# Patient Record
Sex: Female | Born: 1960 | Race: White | Hispanic: No | Marital: Single | Smoking: Never smoker
Health system: Southern US, Community
[De-identification: ages and names within clinical notes are randomized; demographics above are authoritative.]

## PROBLEM LIST (undated history)

## (undated) DIAGNOSIS — F79 Unspecified intellectual disabilities: Secondary | ICD-10-CM

## (undated) DIAGNOSIS — E871 Hypo-osmolality and hyponatremia: Secondary | ICD-10-CM

## (undated) DIAGNOSIS — M6281 Muscle weakness (generalized): Secondary | ICD-10-CM

## (undated) DIAGNOSIS — G40802 Other epilepsy, not intractable, without status epilepticus: Secondary | ICD-10-CM

## (undated) DIAGNOSIS — R569 Unspecified convulsions: Secondary | ICD-10-CM

## (undated) DIAGNOSIS — E785 Hyperlipidemia, unspecified: Secondary | ICD-10-CM

## (undated) DIAGNOSIS — I509 Heart failure, unspecified: Secondary | ICD-10-CM

## (undated) DIAGNOSIS — R532 Functional quadriplegia: Secondary | ICD-10-CM

## (undated) DIAGNOSIS — J45909 Unspecified asthma, uncomplicated: Secondary | ICD-10-CM

## (undated) DIAGNOSIS — I10 Essential (primary) hypertension: Secondary | ICD-10-CM

## (undated) DIAGNOSIS — S069X9A Unspecified intracranial injury with loss of consciousness of unspecified duration, initial encounter: Secondary | ICD-10-CM

## (undated) DIAGNOSIS — R1310 Dysphagia, unspecified: Secondary | ICD-10-CM

## (undated) DIAGNOSIS — G40909 Epilepsy, unspecified, not intractable, without status epilepticus: Secondary | ICD-10-CM

## (undated) DIAGNOSIS — M24541 Contracture, right hand: Secondary | ICD-10-CM

## (undated) DIAGNOSIS — F603 Borderline personality disorder: Secondary | ICD-10-CM

## (undated) DIAGNOSIS — R262 Difficulty in walking, not elsewhere classified: Secondary | ICD-10-CM

## (undated) DIAGNOSIS — G40411 Other generalized epilepsy and epileptic syndromes, intractable, with status epilepticus: Secondary | ICD-10-CM

## (undated) DIAGNOSIS — S069XAA Unspecified intracranial injury with loss of consciousness status unknown, initial encounter: Secondary | ICD-10-CM

## (undated) DIAGNOSIS — D509 Iron deficiency anemia, unspecified: Secondary | ICD-10-CM

## (undated) DIAGNOSIS — N390 Urinary tract infection, site not specified: Secondary | ICD-10-CM

## (undated) DIAGNOSIS — F73 Profound intellectual disabilities: Secondary | ICD-10-CM

## (undated) DIAGNOSIS — R293 Abnormal posture: Secondary | ICD-10-CM

## (undated) DIAGNOSIS — G40309 Generalized idiopathic epilepsy and epileptic syndromes, not intractable, without status epilepticus: Secondary | ICD-10-CM

## (undated) DIAGNOSIS — K219 Gastro-esophageal reflux disease without esophagitis: Secondary | ICD-10-CM

## (undated) DIAGNOSIS — J449 Chronic obstructive pulmonary disease, unspecified: Secondary | ICD-10-CM

## (undated) HISTORY — PX: OTHER SURGICAL HISTORY: SHX169

---

## 2004-03-10 ENCOUNTER — Encounter: Admission: RE | Admit: 2004-03-10 | Discharge: 2004-03-10 | Payer: Self-pay | Admitting: Internal Medicine

## 2011-03-17 ENCOUNTER — Emergency Department (HOSPITAL_COMMUNITY)
Admission: EM | Admit: 2011-03-17 | Discharge: 2011-03-17 | Disposition: A | Discharge status: Home / Self Care | Payer: Medicare Other | Attending: Emergency Medicine | Admitting: Emergency Medicine

## 2011-03-17 ENCOUNTER — Emergency Department (HOSPITAL_COMMUNITY): Payer: Medicare Other

## 2011-03-17 ENCOUNTER — Encounter (HOSPITAL_COMMUNITY): Payer: Self-pay

## 2011-03-17 DIAGNOSIS — S0083XA Contusion of other part of head, initial encounter: Secondary | ICD-10-CM | POA: Insufficient documentation

## 2011-03-17 DIAGNOSIS — Z79899 Other long term (current) drug therapy: Secondary | ICD-10-CM | POA: Insufficient documentation

## 2011-03-17 DIAGNOSIS — F79 Unspecified intellectual disabilities: Secondary | ICD-10-CM | POA: Insufficient documentation

## 2011-03-17 DIAGNOSIS — G40909 Epilepsy, unspecified, not intractable, without status epilepticus: Secondary | ICD-10-CM | POA: Insufficient documentation

## 2011-03-17 DIAGNOSIS — IMO0002 Reserved for concepts with insufficient information to code with codable children: Secondary | ICD-10-CM | POA: Insufficient documentation

## 2011-03-17 DIAGNOSIS — Z87820 Personal history of traumatic brain injury: Secondary | ICD-10-CM | POA: Insufficient documentation

## 2011-03-17 DIAGNOSIS — S0003XA Contusion of scalp, initial encounter: Secondary | ICD-10-CM | POA: Insufficient documentation

## 2011-03-17 DIAGNOSIS — W050XXA Fall from non-moving wheelchair, initial encounter: Secondary | ICD-10-CM | POA: Insufficient documentation

## 2011-03-17 HISTORY — DX: Unspecified convulsions: R56.9

## 2011-03-17 HISTORY — DX: Unspecified intellectual disabilities: F79

## 2011-03-17 HISTORY — DX: Unspecified intracranial injury with loss of consciousness of unspecified duration, initial encounter: S06.9X9A

## 2011-03-17 HISTORY — DX: Unspecified intracranial injury with loss of consciousness status unknown, initial encounter: S06.9XAA

## 2012-03-16 IMAGING — CT CT MAXILLOFACIAL W/O CM
3 of 4 series · 16 of 30 positions shown, 19 images · non-contrast
Comparison: None.

CT HEAD

CLINICAL DATA: Status post fall.  Head and facial injury.  Unknown
loss of consciousness.

CT HEAD WITHOUT CONTRAST
CT MAXILLOFACIAL WITHOUT CONTRAST
TECHNIQUE: Multidetector CT imaging of the head and maxillofacial
structures were performed using the standard protocol without
intravenous contrast. Multiplanar CT image reconstructions of the
maxillofacial structures were also generated.

[Series 2: fast head · axial · 0.47mm/px · z∈[+179,+263]mm · 3 of 32 slices shown]
[im 8/32  bone]
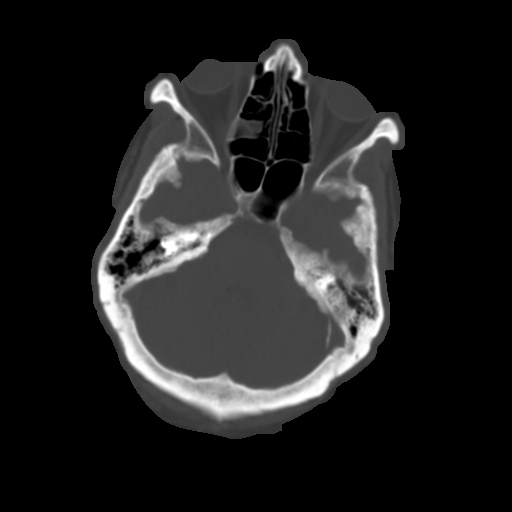
[im 16/32  bone]
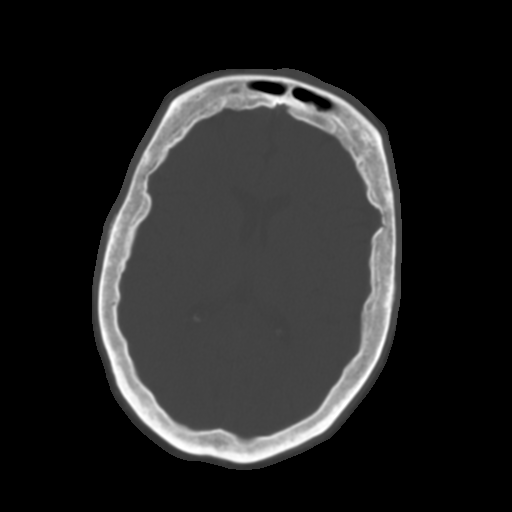
[im 24/32  bone]
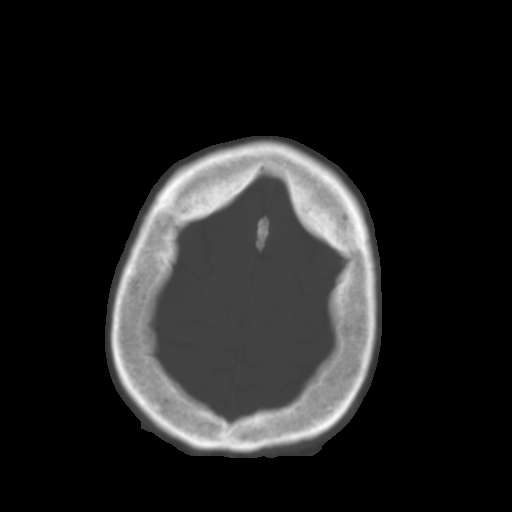

[Series 6: recon 2: supine facial bones · axial · 0.36mm/px · z∈[+52,+207]mm · 9 of 78 slices shown, 12 images]
[im 8/78  brain]
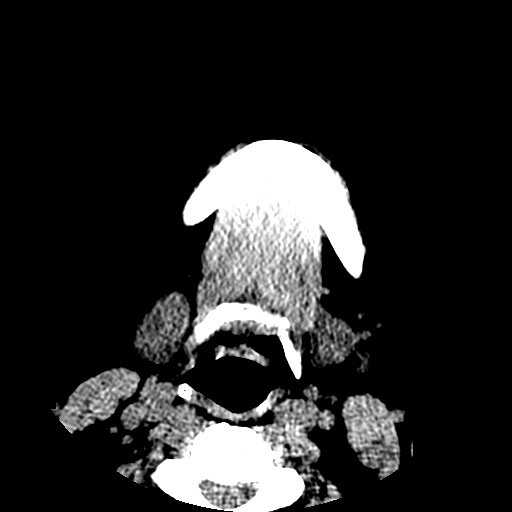
[im 8/78  bone]
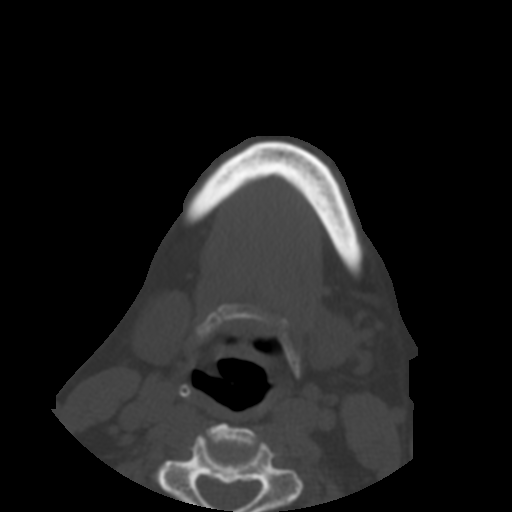
[im 16/78  bone]
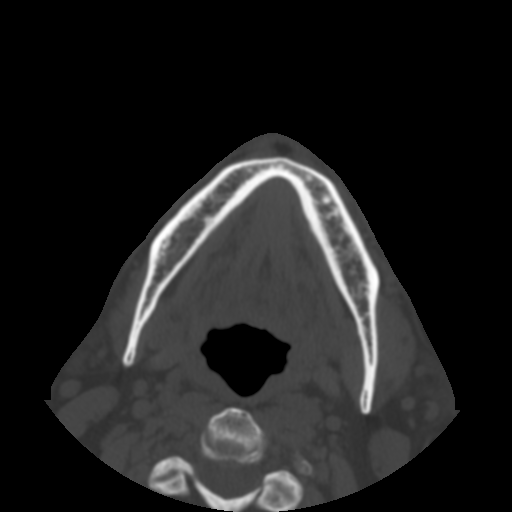
[im 24/78  bone]
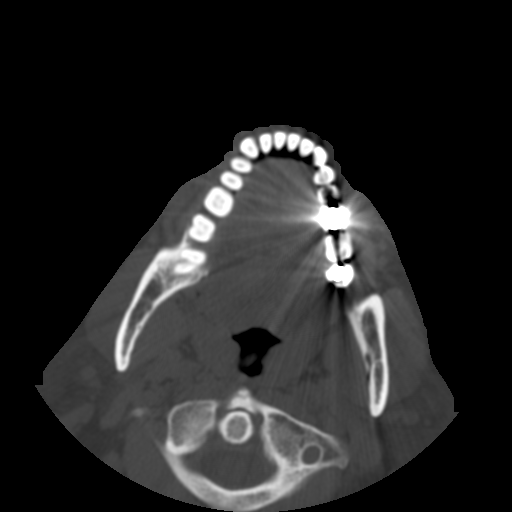
[im 31/78  bone]
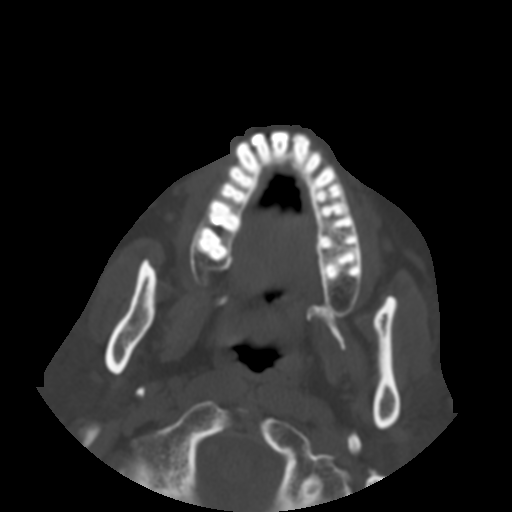
[im 39/78  brain]
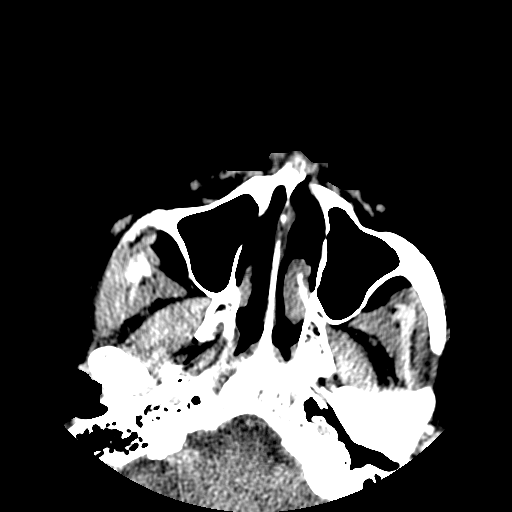
[im 39/78  bone]
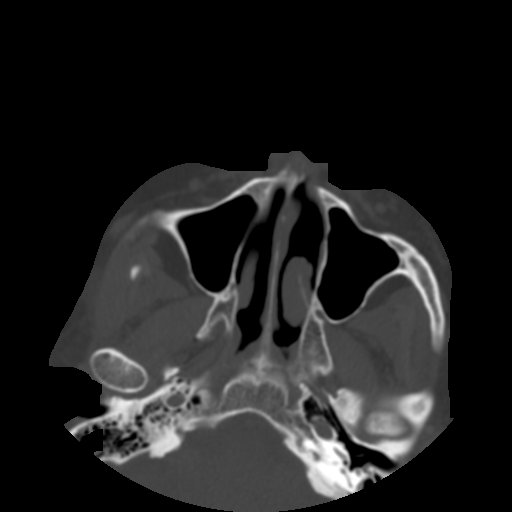
[im 47/78  bone]
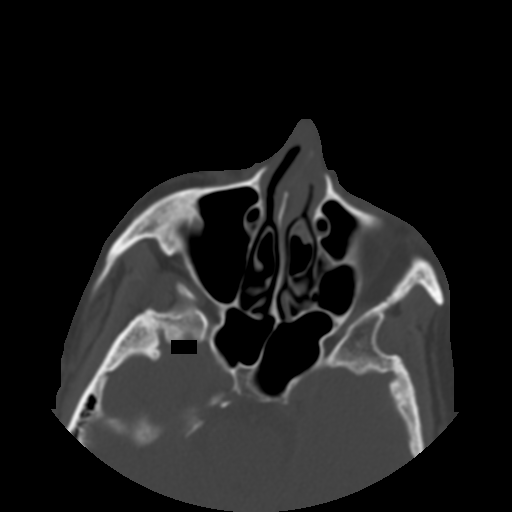
[im 54/78  bone]
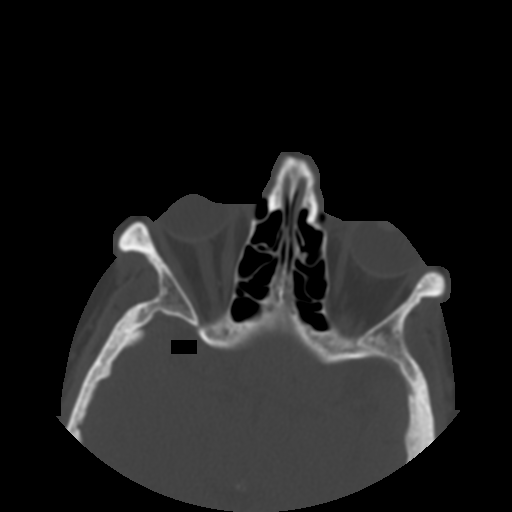
[im 62/78  bone]
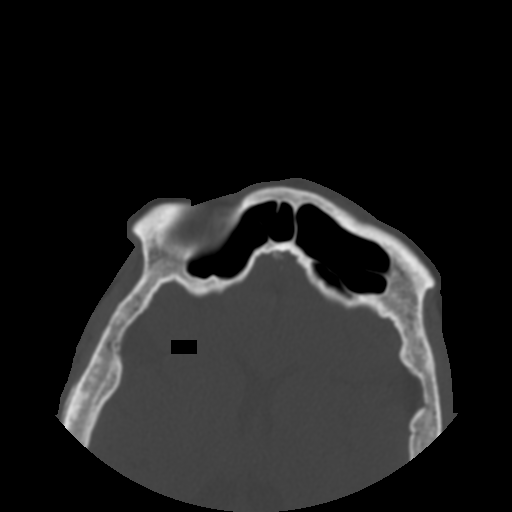
[im 70/78  brain]
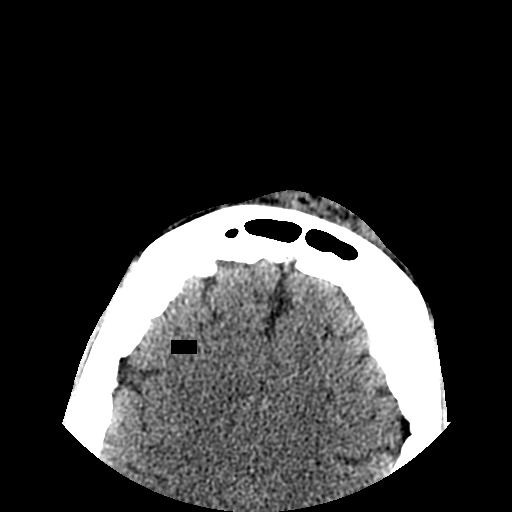
[im 70/78  bone]
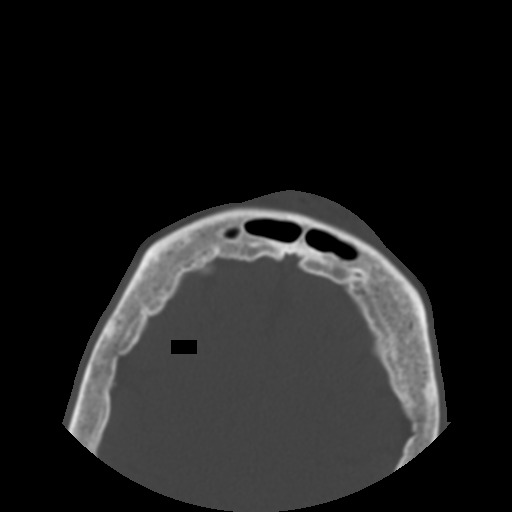

[Series 106: coronal · coronal · 0.43mm/px · 4 of 52 slices shown]
[im 9/52  bone]
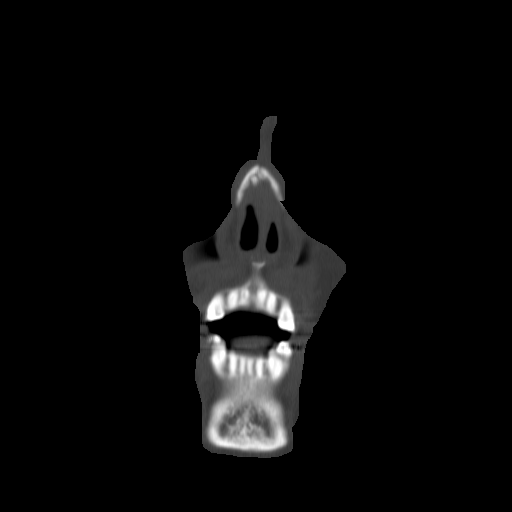
[im 18/52  bone]
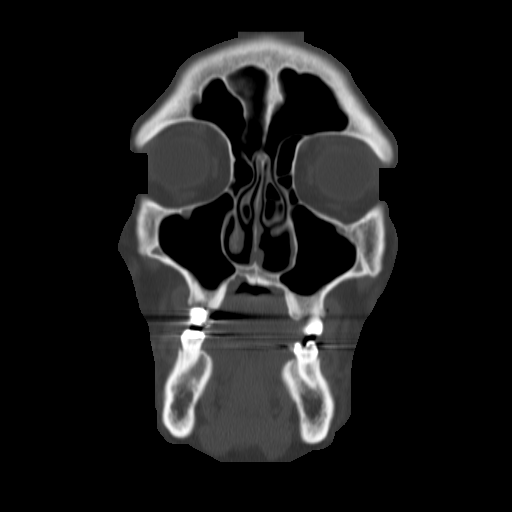
[im 26/52  bone]
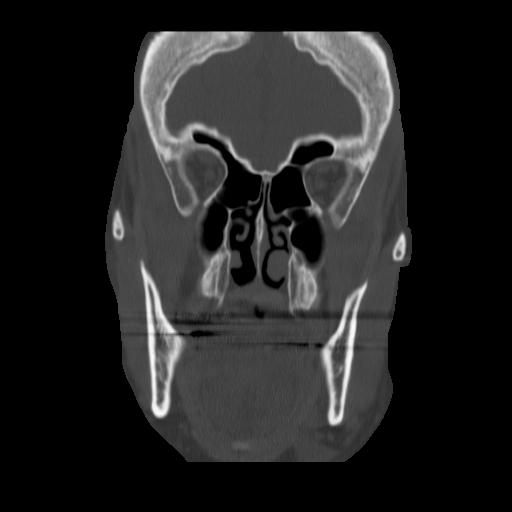
[im 35/52  bone]
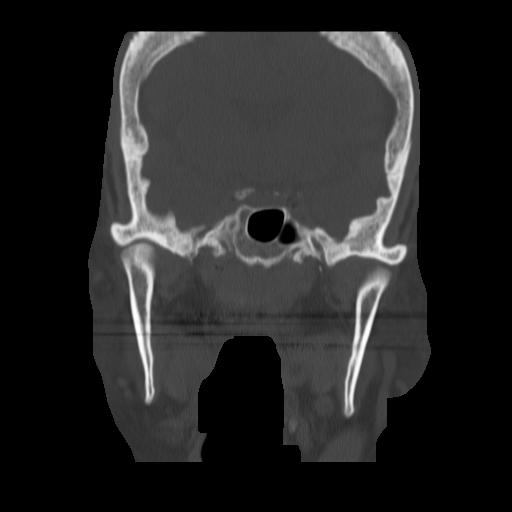

[16 of 30 positions shown; findings below may reference images not displayed]

FINDINGS: There is no evidence of acute intracranial hemorrhage,
mass lesion, brain edema or extra-axial fluid collection.  The
ventricles and subarachnoid spaces are appropriately sized for age.
There is no CT evidence of acute cortical infarction.

The visualized paranasal sinuses are clear. There is soft tissue
swelling in the left frontal scalp.  The calvarium is intact.
There is generalized calvarial hyperostosis.
IMPRESSION: Left frontal scalp injury.  No acute intracranial or calvarial
findings.

CT MAXILLOFACIAL
FINDINGS: There is some irregularity of the right nasal bone
which is suspected to be nonacute.  No definite acute facial
fractures are identified.  There is minimal posterior ethmoid
opacification on the right without air fluid levels.  The
additional paranasal sinuses are clear.  Mastoids and middle ears
are clear.  The mandible and temporomandibular joints are intact.

Frontal scalp soft tissue swelling extends over the bridge of the
nose and left periorbital region.  There is no evidence of orbital
hematoma.  The globes are intact.
IMPRESSION: 1.  Superior facial and left periorbital soft tissue swelling.  No
evidence of orbital hematoma.
2.  Slight irregularity of the right nasal bone may not be acute,
although a nondisplaced fracture in this area is difficult to
exclude.  No displaced fractures are identified.
3.  Minimal right ethmoid opacification.

## 2013-03-29 DIAGNOSIS — G40309 Generalized idiopathic epilepsy and epileptic syndromes, not intractable, without status epilepticus: Secondary | ICD-10-CM

## 2013-05-03 DIAGNOSIS — J309 Allergic rhinitis, unspecified: Secondary | ICD-10-CM

## 2013-05-03 DIAGNOSIS — K219 Gastro-esophageal reflux disease without esophagitis: Secondary | ICD-10-CM

## 2013-05-29 ENCOUNTER — Other Ambulatory Visit: Payer: Self-pay | Admitting: *Deleted

## 2013-05-29 DIAGNOSIS — G40309 Generalized idiopathic epilepsy and epileptic syndromes, not intractable, without status epilepticus: Secondary | ICD-10-CM

## 2013-05-29 MED ORDER — CLONAZEPAM 0.5 MG PO TABS: ORAL_TABLET | ORAL | Status: DC

## 2013-07-12 ENCOUNTER — Non-Acute Institutional Stay (SKILLED_NURSING_FACILITY): Payer: PRIVATE HEALTH INSURANCE | Admitting: Internal Medicine

## 2013-07-12 DIAGNOSIS — F72 Severe intellectual disabilities: Secondary | ICD-10-CM

## 2013-07-12 DIAGNOSIS — G40309 Generalized idiopathic epilepsy and epileptic syndromes, not intractable, without status epilepticus: Secondary | ICD-10-CM

## 2013-07-12 NOTE — Progress Notes (Signed)
Patient ID: Ashley Haley, female   DOB: 1961-11-08, 52 y.o.   MRN: [id number] Facility; [name] Chief complaint; review of medical issues/monthly [facility] visit for June. History; this is a now 52 year old woman who was admitted to the facility in April 2013. 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 She has not had a recent medical issues. I am not completely certain about the frequency of her seizures, some months ago. I increased her medications for seizures that were occurring 54-8 times per month  Past medical history/problem. #1 profound mental retardation. #2 seizure disorders. #3 gastroesophageal reflux.  Medication list is reviewed; in terms of her epilepsy. She is on Trileptal 600 mg 2 tablets twice daily, Keppra, 1500 twice daily, and folic acid, 625 mg twice daily.  Social history; she remains a full code.  Physical exam; Weight is 212 pounds, which appears stable. Temperature 90.1, pulse 78, respirations 18. Respiratory respirations even and nonlabored. Cardiac heart sounds are normal. No murmurs. She does not appear to be dehydrated. Abdomen is distended and nontender.  Impression/plan #1 intractable seizures. I don't really have a sense of the frequency of her seizures. At some point. These were occurring between 54-10 times per month. 2 profound mental retardation. #3 abdominal distention she should be checked for impaction

## 2013-08-20 ENCOUNTER — Non-Acute Institutional Stay (SKILLED_NURSING_FACILITY): Payer: PRIVATE HEALTH INSURANCE | Admitting: Internal Medicine

## 2013-08-20 DIAGNOSIS — G40309 Generalized idiopathic epilepsy and epileptic syndromes, not intractable, without status epilepticus: Secondary | ICD-10-CM

## 2013-08-20 DIAGNOSIS — F72 Severe intellectual disabilities: Secondary | ICD-10-CM

## 2013-08-20 NOTE — Progress Notes (Signed)
Patient ID: Ashley Haley, female   DOB: 1961-12-21, 52 y.o.   MRN: [id number] Facility; [name] Chief complaint; review of medical issues/monthly [facility] visit for [place] History; this is a now 52 year old woman who was admitted to the facility in April 2013. 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 She has not had a recent medical issues. I am not completely certain about the frequency of her seizures, some months ago. I increased her medications for seizures that were occurring 7-8 times per month  Past medical history/problem. #1 profound mental retardation. #2 seizure disorders. #3 gastroesophageal reflux.  Medication list is reviewed; in terms of her epilepsy. She is on Trileptal 600 mg 2 tablets twice daily, Keppra, 1500 twice daily, and Depakote 625 twice a day,.  Social history; she remains a full code.  Physical exam; Weight is 214 pounds, which appears stable. Temperature 96.4 pulse 64 respirations 18 blood pressure 118/66 Respiratory respirations even and nonlabored. Cardiac heart sounds are normal. No murmurs. She does not appear to be dehydrated. Abdomen is distended and nontender.  Impression/plan #1 intractable seizures. I don't really have a sense of the frequency of her seizures. At some point. These were occurring between 8-10 times per month. 2 profound mental retardation. #3 abdominal distention she should be checked for impaction

## 2013-09-18 ENCOUNTER — Non-Acute Institutional Stay (SKILLED_NURSING_FACILITY): Payer: PRIVATE HEALTH INSURANCE | Admitting: Internal Medicine

## 2013-09-18 DIAGNOSIS — F72 Severe intellectual disabilities: Secondary | ICD-10-CM

## 2013-09-18 DIAGNOSIS — G40309 Generalized idiopathic epilepsy and epileptic syndromes, not intractable, without status epilepticus: Secondary | ICD-10-CM

## 2013-09-18 NOTE — Progress Notes (Signed)
Patient ID: Ashley Haley, female   DOB: 09/22/1961, 52 y.o.   MRN: [id number] Facility; [name] Chief complaint; review of medical issues/monthly [facility] visit for August History; this is a now 52 year old woman who was admitted to the facility in April 2013. 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 She has not had a recent medical issues. I am not completely certain about the frequency of her seizures, some months ago. I increased her medications for seizures that were occurring 7-8 times per month  Past medical history/problem. #1 profound mental retardation. #2 seizure disorders. #3 gastroesophageal reflux.  Medication list is reviewed; in terms of her epilepsy. She is on Trileptal 600 mg 2 tablets twice daily, Keppra, 1500 twice daily, and Depakote 625 twice a day,.  Social history; she remains a full code.  Physical exam; Weight is 219 pounds, which appears stable. Temperature 96.4 pulse 70 respirations 18 blood pressure 127/78 Respiratory respirations even and nonlabored. Cardiac heart sounds are normal. No murmurs. She does not appear to be dehydrated. Abdomen is distended and nontender. Neurologic I don't see much difference year. She is awake completely nonverbal  Impression/plan #1 intractable seizures. I don't really have a sense of the frequency of her seizures. At some point, these were occurring between 8-10 times per month. 2 profound mental retardation. #3 abdominal distention she should be checked for impaction

## 2013-10-11 ENCOUNTER — Non-Acute Institutional Stay (SKILLED_NURSING_FACILITY): Payer: PRIVATE HEALTH INSURANCE | Admitting: Internal Medicine

## 2013-10-11 DIAGNOSIS — G40309 Generalized idiopathic epilepsy and epileptic syndromes, not intractable, without status epilepticus: Secondary | ICD-10-CM

## 2013-10-11 DIAGNOSIS — J189 Pneumonia, unspecified organism: Secondary | ICD-10-CM

## 2013-10-11 NOTE — Progress Notes (Signed)
Patient ID: Ashley Haley, female   DOB: 1961/12/07, 52 y.o.   MRN: [id number] Facility; [name] Chief complaint; review of medical issues/monthly [facility] visit for September History; this is a now 52 year old woman who was admitted to the facility in April 2013. 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 She has not had a recent medical issues. The nurse who has her today is  uncertain of any recent seizures. This would certainly be an improvement from several months ago per  The staff approached me today with regards to fever and loose cough for. She had a chest x-ray done yesterday that shows no acute pulmonary disease yet her temperature this afternoon is 101  Past medical history/problem. #1 profound mental retardation. #2 seizure disorders. #3 gastroesophageal reflux.  Medication list is reviewed; in terms of her epilepsy. She is on Trileptal 600 mg 2 tablets twice daily, Keppra, 1500 twice daily, and Depakote 625 twice a day,.  Social history; she remains a full code.  Physical exam; Weight is 219 pounds, which appears stable. Temperature 101 pulse 95 respirations in the 30s and shallow. o2 sats 91% on 4l Gen. she is look to be tachypneic and somewhat flushed Respiratory; crackles in the right lower lobe left lung is reduced the. Cardiac heart sounds are normal. No murmurs. She does not appear to be dehydrated.  Abdomen is distended and nontender. GU question suprapubic tenderness no CVA tenderness is obviou  Impression/plan #1 intractable seizures. . At some point, these were occurring between 8-10 times per month, this appears to be much improved 2 profound mental retardation #3 febrile 101. In spite of the x-ray from yesterday I suspect this woman probably has pneumonitis possibly from aspiration. Her right lower lobe all suggest this although she has not been easy person to examine. . . I am unaware of any viral upper respiratory tract  infection/influenza in the building. Patient is currently a full code we'll talk with [facility] nurse practitioner about whether there are any advanced directives in place. She will need empiric antibiotics, nebulizers.

## 2013-11-13 ENCOUNTER — Non-Acute Institutional Stay (SKILLED_NURSING_FACILITY): Payer: PRIVATE HEALTH INSURANCE | Admitting: Internal Medicine

## 2013-11-13 DIAGNOSIS — J189 Pneumonia, unspecified organism: Secondary | ICD-10-CM

## 2013-11-13 DIAGNOSIS — G40309 Generalized idiopathic epilepsy and epileptic syndromes, not intractable, without status epilepticus: Secondary | ICD-10-CM

## 2013-11-13 NOTE — Progress Notes (Signed)
Patient ID: Ashley Haley, female   DOB: 09-03-1961, 52 y.o.   MRN: [id number] Facility; [name] Chief complaint; review of medical issues/monthly [facility] visit for October History; this is a now 52 year old woman who was admitted to the facility in April 2013. 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 She has not had a recent medical issues. The nurse who has her today is  uncertain of any recent seizures. This would certainly be an improvement from several months ago.  We treated her in October for aspiration pneumonitis. Suspect this is soon to be a recurrent problem. She also received some IV fluid  Lab work from October 22 is her for reviewed. Her white count was 12.7 hemoglobin is 14.6 however her MCV is 107.2 basic metabolic panel was essentially normal  Past medical history/problem. #1 profound mental retardation. #2 seizure disorders. #3 gastroesophageal reflux.  Medication list is reviewed; in terms of her epilepsy. She is on Trileptal 600 mg 2 tablets twice daily, Keppra, 1500 twice daily, and Depakote 625 twice a day,.  Social history; she has been changed to a DO NOT RESUSCITATE  Physical exam; Weight is 218 pounds, which appears stable. Temperature 99.1 pulse 95 respirations in the 30s and shallow. o2 sats 94% on 3 L Gen. Appears to be in no distress Respiratory; c. Shallow breathing bilaterally however her respirations are nonlabored Cardiac heart sounds are normal. No murmurs. She does not appear to be dehydrated.  Abdomen is distended and nontender.  Impression/plan #1 intractable seizures. . At some point, these were occurring between 8-10 times per month, this appears to be much improved.  #2 I suspect recurrent aspiration pneumonitis #3 macrocytosis without anemia and a normal RDW. I think a workup of this would be largely academic in this case.

## 2013-11-27 ENCOUNTER — Other Ambulatory Visit: Payer: Self-pay | Admitting: *Deleted

## 2013-11-27 MED ORDER — CLONAZEPAM 0.5 MG PO TABS: ORAL_TABLET | ORAL | Status: DC

## 2013-12-10 ENCOUNTER — Non-Acute Institutional Stay (SKILLED_NURSING_FACILITY): Payer: PRIVATE HEALTH INSURANCE | Admitting: Internal Medicine

## 2013-12-10 DIAGNOSIS — G40309 Generalized idiopathic epilepsy and epileptic syndromes, not intractable, without status epilepticus: Secondary | ICD-10-CM

## 2013-12-10 DIAGNOSIS — F72 Severe intellectual disabilities: Secondary | ICD-10-CM

## 2013-12-10 NOTE — Progress Notes (Signed)
Patient ID: Ashley Haley, female   DOB: Jun 29, 1961, 52 y.o.   MRN: [id number] Facility; [name] Chief complaint; review of medical issues/monthly [employer] visit for November History; this is a now 52 year old woman who was admitted to the facility in April 2013. 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 She has not had a recent medical issues. The nurse who has her today is  uncertain of any recent seizures. This would certainly be an improvement from several months ago.  We treated her in October for aspiration pneumonitis. Suspect this is soon to be a recurrent problem. She also received some IV fluid  Lab work from October 22 is her for reviewed. Her white count was 12.7 hemoglobin is 14.6 however her MCV is 107.2 basic metabolic panel was essentially normal  Past medical history/problem. #1 profound mental retardation. #2 seizure disorders. #3 gastroesophageal reflux.  Medication list is reviewed; in terms of her epilepsy. She is on Trileptal 600 mg 2 tablets twice daily, Keppra, 1500 twice daily, and Depakote 625 twice a day,.  Social history; she has been changed to a DO NOT RESUSCITATE  Physical exam; Weight is 218 pounds, which appears stable. Temperature is 96.9 pulse 88 respirations 18 blood pressure 138/80 Gen. Appears to be in no distress Respiratory; c. Shallow breathing bilaterally however her respirations are nonlabored Cardiac heart sounds are normal. No murmurs. She does not appear to be dehydrated.  Abdomen is distended and nontender.  Impression/plan #1 intractable seizures. . At some point, these were occurring between 8-10 times per month, this appears to be much improved.  #2 I suspect recurrent aspiration pneumonitis. She has a history of gastroesophageal reflux disease as well on omeprazole #3 macrocytosis without anemia and a normal RDW. I think a workup of this would be largely academic in this case.

## 2013-12-28 ENCOUNTER — Other Ambulatory Visit (HOSPITAL_BASED_OUTPATIENT_CLINIC_OR_DEPARTMENT_OTHER): Payer: Self-pay | Admitting: Internal Medicine

## 2013-12-28 DIAGNOSIS — R1310 Dysphagia, unspecified: Secondary | ICD-10-CM

## 2014-01-04 ENCOUNTER — Ambulatory Visit (HOSPITAL_COMMUNITY)
Admission: RE | Admit: 2014-01-04 | Discharge: 2014-01-04 | Disposition: A | Discharge status: Home / Self Care | Payer: PRIVATE HEALTH INSURANCE | Source: Ambulatory Visit | Attending: Internal Medicine | Admitting: Internal Medicine

## 2014-01-04 ENCOUNTER — Other Ambulatory Visit (HOSPITAL_BASED_OUTPATIENT_CLINIC_OR_DEPARTMENT_OTHER): Payer: Self-pay | Admitting: Internal Medicine

## 2014-01-04 DIAGNOSIS — R1312 Dysphagia, oropharyngeal phase: Secondary | ICD-10-CM | POA: Insufficient documentation

## 2014-01-04 DIAGNOSIS — R1310 Dysphagia, unspecified: Secondary | ICD-10-CM

## 2014-01-04 DIAGNOSIS — IMO0001 Reserved for inherently not codable concepts without codable children: Secondary | ICD-10-CM | POA: Insufficient documentation

## 2014-01-04 NOTE — Procedures (Signed)
Objective Swallowing Evaluation: Modified Barium Swallowing Study   Patient Details  Name: Ashley Haley MRN: [id number] Date of Birth: [birth year]  Today's Date: 01/04/2014 Time:10:25 AM  - 10:55 AM    Past Medical History:  Past Medical History  Diagnosis Date  . Mental retardation   . TBI (traumatic brain injury)   . Seizures    Past Surgical History: No past surgical history on file. HPI:  Ashley Haley is a 53 year old woman who was [facility] 2013. 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 She was referred by Dr. Leanord Hawkingobson for MBSS due to suspected silent aspiration of thin liquids. A FEES was attempted per treating SLP, Salli Realana Thompson, however pt refused. Pt is nonverbal, does not follow commands, and requires a lift for transfers.     Recommendation/Prognosis  Clinical Impression Dysphagia Diagnosis: Mild oral phase dysphagia;Mild pharyngeal phase dysphagia  Clinical impression: Pt presents with mild oropharyngeal phase dysphagia in setting of impaired cognition characterized by mildly reduced oral control, inability (vs poor comprehension of task) to propel pill posteriorly and instead masticated, premature spillage of liquids over the base of tongue with swallow trigger at the level of the valleculae. Only one episode of flash penetration of thins was witnessed today, however visualizations was difficult due to pt size and positioning. Swallow trigger was prompt and swift in execution. Mild vallecular pooling noted after the swallow with thins from oral residuals trickling down base of tongue. Pt was unable to suck from straw, so all liquids were presented via teaspoon and cup. Pt had one delayed coughing episode, however nothing visualized in laryngeal vestibule (only in valleculae). Pt is at an increased risk for aspiration given cognitive impairment, history of seizures, and inability to self feed. Pt also likely negatively impacted  by head rocking (oral control). While pt appeared safe with thin liquids today, nectar-thick liquids may be more appropriate if she clinically appears to tolerate better (ie. no delayed coughing). SLP discussed results of MBSS with treating SLP. Consider upgrading textures to mechanical soft and thin vs. nectar per treating SLP.   Swallow Evaluation Recommendations Diet Recommendations: Dysphagia 3 (Mechanical Soft);Nectar-thick liquid;Thin liquid Liquid Administration via: Cup Medication Administration: Crushed with puree Supervision: Staff to assist with self feeding Compensations: Slow rate;Multiple dry swallows after each bite/sip Postural Changes and/or Swallow Maneuvers: Seated upright 90 degrees;Upright 30-60 min after meal;Out of bed for meals Oral Care Recommendations: Oral care BID;Staff/trained caregiver to provide oral care Other Recommendations: Clarify dietary restrictions Follow up Recommendations: Skilled Nursing facility Prognosis Barriers to Reach Goals: Cognitive deficits Individuals Consulted Consulted and Agree with Results and Recommendations: Patient unable/family or caregiver not available;Other (Comment) (treating SLP) Report Sent to : Primary SLP;[facility]ility (Comment) [name]  General:  Date of Onset: 12/25/13 HPI: Ashley Haley is a 53 year old woman who [facility]. 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 She was referred by Dr. Leanord Hawkingobson for MBSS due to suspected silent aspiration of thin liquids. A FEES was attempted per treating SLP, Salli Realana Thompson, however pt refused. Pt is nonverbal, does not follow commands, and requires a lift for transfers. Type of Study: Modified Barium Swallowing Study Reason for Referral: Objectively evaluate swallowing function Previous Swallow Assessment: None on record Diet Prior to this Study: Dysphagia 1 (puree);Nectar-thick liquids Temperature Spikes Noted:  No Respiratory Status: Room air History of Recent Intubation: No Behavior/Cognition: Alert;Doesn't follow directions;Other (comment) (nonverbal) Oral Cavity - Dentition: Adequate natural  dentition Oral Motor / Sensory Function: Within functional limits Self-Feeding Abilities: Total assist Patient Positioning: Upright in chair Baseline Vocal Quality: Other (comment) (nonverbal) Volitional Cough: Cognitively unable to elicit Volitional Swallow: Unable to elicit Anatomy: Within functional limits Pharyngeal Secretions: Not observed secondary MBS  Reason for Referral:  Objectively evaluate swallowing function   Oral Phase Oral Preparation/Oral Phase Oral Phase: Impaired Oral - Honey Oral - Honey Cup: Within functional limits Oral - Nectar Oral - Nectar Cup: Within functional limits Oral - Thin Oral - Thin Teaspoon: Within functional limits Oral - Thin Cup: Other (Comment) (premature spillage) Oral - Solids Oral - Puree: Within functional limits Oral - Regular: Within functional limits Oral - Pill: Impaired mastication;Other (Comment) (pt unable to swallow whole, masticated) Oral Phase - Comment Oral Phase - Comment: Pt with seemingly involuntary head movements Pharyngeal Phase  Pharyngeal Phase Pharyngeal Phase: Impaired Pharyngeal - Honey Pharyngeal - Honey Cup: Within functional limits;Premature spillage to valleculae Pharyngeal - Nectar Pharyngeal - Nectar Cup: Premature spillage to valleculae Pharyngeal - Thin Pharyngeal - Thin Cup: Premature spillage to valleculae;Premature spillage to pyriform sinuses;Penetration/Aspiration during swallow;Pharyngeal residue - valleculae Penetration/Aspiration details (thin cup): Material does not enter airway;Material enters airway, remains ABOVE vocal cords then ejected out Pharyngeal - Solids Pharyngeal - Puree: Within functional limits Pharyngeal - Regular: Within functional limits Cervical Esophageal Phase  Cervical Esophageal  Phase Cervical Esophageal Phase:  (Unable to visualize due to increased body habitus)   G-Codes Functional Assessment Tool Used: MBSS Functional Limitations: Swallowing Swallow Current Status (Z6109): At least 20 percent but less than 40 percent impaired, limited or restricted Swallow Goal Status [phone]): At least 20 percent but less than 40 percent impaired, limited or restricted Swallow Discharge Status [phone]): At least 20 percent but less than 40 percent impaired, limited or restricted   Thank you,  Havery Moros, CCC-SLP [phone]  Kanika Bungert 01/04/2014, 2:00 PM

## 2014-01-08 ENCOUNTER — Non-Acute Institutional Stay (SKILLED_NURSING_FACILITY): Payer: PRIVATE HEALTH INSURANCE | Admitting: Internal Medicine

## 2014-01-08 DIAGNOSIS — G40309 Generalized idiopathic epilepsy and epileptic syndromes, not intractable, without status epilepticus: Secondary | ICD-10-CM

## 2014-01-08 DIAGNOSIS — F72 Severe intellectual disabilities: Secondary | ICD-10-CM

## 2014-01-08 NOTE — Progress Notes (Signed)
Patient ID: Ashley Haley, female   DOB: [birth year], 53 y.o.   MRN: [id number] [facility] Chief complaint; review of medical issues/monthly [employer] visit for December History; this is a now 53 year old woman who was admitted to the facility in April 2013. 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 She has not had a recent medical issues. The nurse who has her today is  uncertain of any recent seizures. This would certainly be an improvement from several months ago.  We treated her in October for aspiration pneumonitis. Suspect this is soon to be a recurrent problem. She also received some IV fluid  Lab work from October 22 is her for reviewed. Her white count was 12.7 hemoglobin is 14.6 however her MCV is 107.2 basic metabolic panel was essentially normal  Past medical history/problem. #1 profound mental retardation. #2 seizure disorders. #3 gastroesophageal reflux.  Medication list is reviewed; in terms of her epilepsy. She is on Trileptal 600 mg 2 tablets twice daily, Keppra, 1500 twice daily, and Depakote 625 twice a day,.  Social history; she has been changed to a DO NOT RESUSCITATE. Also has a do not hospitalize status as of November 2014  Physical exam; Weight is 218 pounds, which appears stable. Temperature is 96.9 pulse 88 respirations 18 blood pressure 138/80 Gen. Appears to be in no distress Respiratory; c. Shallow breathing bilaterally however her respirations are nonlabored Cardiac heart sounds are normal. No murmurs. She does not appear to be dehydrated.  Abdomen is distended and nontender.  Impression/plan #1 intractable seizures. . At some point, these were occurring between 8-10 times per month, this appears to be much improved.  #2 I suspect recurrent aspiration pneumonitis. She has a history of gastroesophageal reflux disease as well on omeprazole #3 macrocytosis without anemia and a normal RDW. I think a workup of this would be  largely academic in this case.

## 2014-01-24 ENCOUNTER — Non-Acute Institutional Stay (SKILLED_NURSING_FACILITY): Payer: PRIVATE HEALTH INSURANCE | Admitting: Internal Medicine

## 2014-01-24 DIAGNOSIS — G40309 Generalized idiopathic epilepsy and epileptic syndromes, not intractable, without status epilepticus: Secondary | ICD-10-CM

## 2014-01-24 NOTE — Progress Notes (Signed)
Patient ID: Ashley Haley, female   DOB: 1961-08-04, 53 y.o.   MRN: [id number] [facility] Chief complaint epilepsy History this is a 53 year old woman with advanced mental retardation she also has a history of seizure disorder. She is currently on Trileptal 1 200 mg twice daily [2x600 mg tabs], Keppra 1500 mg twice daily, Depakote 125 mg 5 capsules equals 625 mg by mouth twice daily  She was observed today to have a generalized seizure. This was characterized by a sudden cry and then generalized tonic clonic movements which lasted about 45 seconds are itchy now seems back to baseline her breathing seems stable. She does have a sub-conjunctiva with hemorrhage in the medial aspect of her left eye   Lab work; from 12/23 showed a normal CBC she has not had her electrolytes her valproic acid level checked in quite some time    Physical examination Gen. patient is in no distress Vitals pulse rate 72 respirations 18 and unlabored pulse ox 92% on room air Respiratory shallower entry bilaterally no crackles or wheezes Cardiac heart sounds are normal there is no murmur Neurologic I don't see any obvious problems here. She seems back to her baseline mental status  Impression/plan #1 generalized seizure in the setting of chronic seizure disorder. I will check her blood chemistry. At one point her seizures were a lot more frequent than they appear to be now she does not seem to have suffered any ill effects

## 2014-01-26 ENCOUNTER — Other Ambulatory Visit: Payer: Self-pay | Admitting: *Deleted

## 2014-01-26 MED ORDER — LORAZEPAM 2 MG/ML IJ SOLN: INTRAMUSCULAR | Status: DC

## 2014-01-26 NOTE — Telephone Encounter (Signed)
[facility] 

## 2014-02-15 ENCOUNTER — Non-Acute Institutional Stay (SKILLED_NURSING_FACILITY): Payer: PRIVATE HEALTH INSURANCE | Admitting: Internal Medicine

## 2014-02-15 DIAGNOSIS — F72 Severe intellectual disabilities: Secondary | ICD-10-CM

## 2014-02-15 DIAGNOSIS — G40309 Generalized idiopathic epilepsy and epileptic syndromes, not intractable, without status epilepticus: Secondary | ICD-10-CM

## 2014-02-15 NOTE — Progress Notes (Signed)
Patient ID: Ashley Haley, female   DOB: [birth year], 53 y.o.   MRN: [id number]  Facility; [facility] Chief complaint; review of medical issues/monthly [employer] visit for January History; this is a now 53 year old woman who was admitted to the facility in April 2013. 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 She has not had a recent medical issues. I saw her earlier this month after having a generalized seizure. She apparently went on to have 1 more that evening and another one that morning. I do not see the lab work I ordered. She was given an order for Ativan. She is on Trileptal 2 tablets 1200 twice a day, Keppra 1500 mg twice daily and valproic acid sprinkles 625 twice a day    Past medical history/problem. #1 profound mental retardation. #2 seizure disorders. #3 gastroesophageal reflux.  Medication list is reviewed; in terms of her epilepsy. She is on Trileptal 600 mg 2 tablets twice daily, Keppra, 1500 twice daily, and Depakote 625 twice a day,.  Social history; she has been changed to a DO NOT RESUSCITATE. Also has a do not hospitalize status as of November 2014  Physical exam; Weight is 222 pounds, which appears stable. Temperature is 96.9 pulse 88 respirations 18 blood pressure 120/80 Gen. Appears to be in no distress Respiratory; c. Shallow breathing bilaterally however her respirations are nonlabored Cardiac heart sounds are normal. No murmurs. She does not appear to be dehydrated.  Abdomen is distended and nontender.  Impression/plan #1 intractable seizures; lab work that I ordered earlier this month does not appear to be on the record. She had 3 seizures in the course abruptly 12-15 hours but does not appear to of had any since.  #2 I suspect recurrent aspiration pneumonitis. She has a history of gastroesophageal reflux disease as well on omeprazole #3 macrocytosis without anemia and a normal RDW. I think a workup of this would be largely academic  in this case. B12 level and folate level have been checked and were normal

## 2014-02-28 ENCOUNTER — Non-Acute Institutional Stay (SKILLED_NURSING_FACILITY): Payer: PRIVATE HEALTH INSURANCE | Admitting: Internal Medicine

## 2014-02-28 DIAGNOSIS — R609 Edema, unspecified: Secondary | ICD-10-CM

## 2014-02-28 DIAGNOSIS — F79 Unspecified intellectual disabilities: Secondary | ICD-10-CM | POA: Insufficient documentation

## 2014-02-28 DIAGNOSIS — R569 Unspecified convulsions: Secondary | ICD-10-CM

## 2014-02-28 NOTE — Progress Notes (Signed)
Patient ID: Ashley Haley, female   DOB: 1961/06/08, 53 y.o.   MRN: [id number]   Facility; [facility] Chief complaint; review of medical issues/monthly [employer] visit for February History; this is a now 53 year old woman who was admitted to the facility in April 2013. 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 She has not had a recent medical issues. I saw her earlier this month after having a generalized seizure. She apparently went on to have 1 more that evening and another one that morning. I do not see the lab work I ordered. She was given an order for Ativan. She is on Trileptal 2 tablets 1200 twice a day, Keppra 1500 mg twice daily and valproic acid sprinkles 625 twice a day  No further seizures in february    Past medical history/problem. #1 profound mental retardation. #2 seizure disorders. #3 gastroesophageal reflux.  Medication list is reviewed; in terms of her epilepsy. She is on Trileptal 600 mg 2 tablets twice daily, Keppra, 1500 twice daily, and Depakote 625 twice a day,.  Social history; she has been changed to a DO NOT RESUSCITATE. Also has a do not hospitalize status as of November 2014  Physical exam; Vitals: T-96.2 p-80 RR16 BP120/74 Weight 217 O2sat 95% Gen. Appears to be in no distress Respiratory; . Shallow breathing bilaterally however her respirations are nonlabored Cardiac heart sounds are normal. No murmurs. She does not appear to be dehydrated.  Abdomen is distended and nontender.  Impression/plan #1 intractable seizures; lab work that I ordered earlier this month does not appear to be on the record. She had 3 seizures in the course abruptly 12-15 hours but does not appear to have any further seizures.  #2 I suspect recurrent aspiration pneumonitis. She has a history of gastroesophageal reflux disease as well on omeprazole #3 macrocytosis without anemia and a normal RDW. I think a workup of this would be largely academic in this  case. B12 level and folate level have been checked and were normal

## 2014-04-14 ENCOUNTER — Non-Acute Institutional Stay (SKILLED_NURSING_FACILITY): Payer: PRIVATE HEALTH INSURANCE | Admitting: Internal Medicine

## 2014-04-14 DIAGNOSIS — F79 Unspecified intellectual disabilities: Secondary | ICD-10-CM

## 2014-04-14 DIAGNOSIS — R569 Unspecified convulsions: Secondary | ICD-10-CM

## 2014-04-14 DIAGNOSIS — R609 Edema, unspecified: Secondary | ICD-10-CM

## 2014-04-14 NOTE — Progress Notes (Signed)
Patient ID: Ashley Haley, female   DOB: Jan 22, 1961, 53 y.o.   MRN: [id number]   Facility; [facility] Chief complaint; review of medical issues/monthly [employer] visit for march History; this is a now 53 year old woman who was admitted to the facility in April 2013. 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 She has not had a recent medical issues. I saw her earlier several months ago after having a generalized seizure. She apparently went on to have 1 more that evening and another one that morning. I do not see the lab work I ordered. She was given an order for Ativan. She is on Trileptal 2 tablets 1200 twice a day, Keppra 1500 mg twice daily and valproic acid sprinkles 625 twice a day  No further seizures in March  She has been noted to have edema in her legs that appear previous echo was within normal limits and BNP was within normal limits. She has compression stockings however for some reason compliance is not perfect it.    Past medical history/problem. #1 profound mental retardation. #2 seizure disorders. #3 gastroesophageal reflux.  Medication list is reviewed; in terms of her epilepsy. She is on Trileptal 600 mg 2 tablets twice daily, Keppra, 1500 twice daily, and Depakote 625 twice a day,.  Social history; she has been changed to a DO NOT RESUSCITATE. Also has a do not hospitalize status as of November 2014  Physical exam; Vitals:  Gen. Appears to be in no distress Respiratory; . Shallow breathing bilaterally however her respirations are nonlabored Cardiac heart sounds are normal. No murmurs. She does not appear to be dehydrated.  Abdomen is distended and nontender.  Impression/plan #1 intractable seizures; lab work that I ordered earlier this month does not appear to be on the record. She had 3 seizures in the course abruptly 12-15 hours but does not appear to have any further seizures.  #2 I suspect recurrent aspiration pneumonitis. She has a  history of gastroesophageal reflux disease as well on omeprazole #3 macrocytosis without anemia and a normal RDW. I think a workup of this would be largely academic in this case. B12 level and folate level have been checked and were normal #4 edema I suspect this is related to her overall debilitated neurologic status. She has minimal edema today

## 2014-05-28 ENCOUNTER — Other Ambulatory Visit: Payer: Self-pay | Admitting: *Deleted

## 2014-05-28 MED ORDER — CLONAZEPAM 0.5 MG PO TABS: ORAL_TABLET | ORAL | Status: DC

## 2014-05-28 NOTE — Telephone Encounter (Signed)
[facility] 

## 2014-06-04 ENCOUNTER — Non-Acute Institutional Stay (SKILLED_NURSING_FACILITY): Payer: PRIVATE HEALTH INSURANCE | Admitting: Internal Medicine

## 2014-06-04 DIAGNOSIS — R569 Unspecified convulsions: Secondary | ICD-10-CM

## 2014-06-04 DIAGNOSIS — F79 Unspecified intellectual disabilities: Secondary | ICD-10-CM

## 2014-06-04 DIAGNOSIS — R609 Edema, unspecified: Secondary | ICD-10-CM

## 2014-06-04 NOTE — Progress Notes (Signed)
Patient ID: Ashley Haley, female   DOB: 1961-04-30, 53 y.o.   MRN: [id number]   Facility; [facility] Chief complaint; review of medical issues/monthly [employer] visit for May History; this is a now 53 year old woman who was admitted to the facility in April 2013. 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 She has not had a recent medical issues. I saw her earlier several months ago after having a generalized seizure. She apparently went on to have 1 more that evening and another one that morning. I do not see the lab work I ordered. She was given an order for Ativan. She is on Trileptal 2 tablets 1200 twice a day, Keppra 1500 mg twice daily and valproic acid sprinkles 625 twice a day  No further seizures Noted.      Past medical history/problem. #1 profound mental retardation. #2 seizure disorders. #3 gastroesophageal reflux.  Medication list is reviewed;  Vitamin C 3000 units daily Prilosec 20 daily Claritin 10 daily Colace 100 twice a day Trileptal 602 tablets 1200 mg twice a day Keppra 1500 twice a day Depakote 125 5 capsules 625 mg twice daily Flonase 51 spray in each nostril twice daily Klonopin 0.53 times daily Lipitor 10 daily  Social history; she has been changed to a DO NOT RESUSCITATE. Also has a do not hospitalize status as of November 2014  Physical exam; Vitals:  Gen. Appears to be in no distress Respiratory; . Shallow breathing bilaterally however her respirations are nonlabored Cardiac heart sounds are normal. No murmurs. She does not appear to be dehydrated.  Abdomen is distended and nontender.  Impression/plan #1 intractable seizures; the frequency of her seizures is never really clear as some of them appear to be unwitnessed nonetheless there is not a major issue here #2 I suspect recurrent aspiration pneumonitis. She has a history of gastroesophageal reflux disease as well on omeprazole #3 macrocytosis without anemia and a  normal RDW. I think a workup of this would be largely academic in this case. B12 level and folate level have been checked and were normal #4 edema I suspect this is related to her overall debilitated neurologic status. She has minimal edema today. She previously had a 2-D echo that was negative for heart failure BNP was normal. Compression stockings ordered but the not really 100% compliance

## 2014-06-25 ENCOUNTER — Non-Acute Institutional Stay (SKILLED_NURSING_FACILITY): Payer: PRIVATE HEALTH INSURANCE | Admitting: Internal Medicine

## 2014-06-25 DIAGNOSIS — J309 Allergic rhinitis, unspecified: Secondary | ICD-10-CM

## 2014-06-25 DIAGNOSIS — E785 Hyperlipidemia, unspecified: Secondary | ICD-10-CM

## 2014-06-25 NOTE — Progress Notes (Signed)
Patient ID: Ashley Haley, female   DOB: 1961-05-18, 53 y.o.   MRN: [id number]   Facility; [facility] Chief complaint; review of medical issues/monthly [employer] visit for June History; this is a now 53 year old woman who was admitted to the facility in April 2013. 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 She has not had a recent medical issues. I saw her earlier several months ago after having a generalized seizure. She apparently went on to have 1 more that evening and another one that morning. I do not see the lab work I ordered. She was given an order for Ativan. She is on Trileptal 2 tablets 1200 twice a day, Keppra 1500 mg twice daily and valproic acid sprinkles 625 twice a day  No further seizures Noted.      Past medical history/problem. #1 profound mental retardation. #2 seizure disorders. #3 gastroesophageal reflux.  Medication list is reviewed;  Vitamin C 3000 units daily Prilosec 20 daily Claritin 10 daily Colace 100 twice a day Trileptal 600 tablets 1200 mg twice a day Keppra 1500 twice a day Depakote 125 5 capsules 625 mg twice daily Flonase 51 spray in each nostril twice daily Klonopin 0.5 3 times daily Lipitor 10 daily  Social history; she has been changed to a DO NOT RESUSCITATE. Also has a do not hospitalize status as of November 2014  Physical exam; Vitals: Temperature 97.3-pulse 71-respirations 19-blood pressure 110/72-weight 225 pounds  Gen. Appears to be in no distress Respiratory; . Shallow breathing bilaterally however her respirations are nonlabored Cardiac heart sounds are normal. No murmurs. She does not appear to be dehydrated.  Abdomen is distended and nontender.  Impression/plan #1 intractable seizures; the frequency of her seizures is never really clear as some of them appear to be unwitnessed nonetheless there is not a major issue here #2 I suspect recurrent aspiration pneumonitis. She has a history of  gastroesophageal reflux disease as well on omeprazole #3 macrocytosis without anemia and a normal RDW. I think a workup of this would be largely academic in this case. B12 level and folate level have been checked and were normal #4 edema I suspect this is related to her overall debilitated neurologic status. She has minimal edema today. She previously had a 2-D echo that was negative for heart failure BNP was normal. Compression stockings ordered but the not really 100% compliance

## 2014-08-20 ENCOUNTER — Non-Acute Institutional Stay (SKILLED_NURSING_FACILITY): Payer: PRIVATE HEALTH INSURANCE | Admitting: Internal Medicine

## 2014-08-20 DIAGNOSIS — F79 Unspecified intellectual disabilities: Secondary | ICD-10-CM

## 2014-08-20 DIAGNOSIS — R569 Unspecified convulsions: Secondary | ICD-10-CM

## 2014-08-20 NOTE — Progress Notes (Signed)
Patient ID: Ashley Haley, female   DOB: 27-Jan-1961, 53 y.o.   MRN: [id number]     Facility; [name] Chief complaint; review of medical issues/monthly [employer] visit for July History; this is a now 53 year old woman who was admitted to the facility in April 2013. 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 She has not had a recent medical issues. I saw her earlier several months ago after having a generalized seizure. She apparently went on to have 1 more that evening and another one that morning. I do not see the lab work I ordered. She was given an order for Ativan. She is on Trileptal 2 tablets 1200 twice a day, Keppra 1500 mg twice daily and valproic acid sprinkles 625 twice a day  Labw; 8/4 Na 128/K 4.7/Cl 85/CO2 31 BUN 6 Cr. 0.32 LDL 117      Past medical history/problem. #1 profound mental retardation. #2 seizure disorders. #3 gastroesophageal reflux.  Medication list is reviewed;  Vitamin C 3000 units daily Prilosec 20 daily Claritin 10 daily Colace 100 twice a day Trileptal 600 tablets 1200 mg twice a day Keppra 1500 twice a day Depakote 125 5 capsules 625 mg twice daily Flonase 51 spray in each nostril twice daily Klonopin 0.5 3 times daily Lipitor 10 daily  Social history; she has been changed to a DO NOT RESUSCITATE. Also has a do not hospitalize status as of November 2014  Physical exam; Vitals: Temperature 98.2-pulse 82-respirations 21-blood pressure 100/70-weight 232 pounds  Gen. Appears to be in no distress Respiratory; . Shallow breathing bilaterally however her respirations are nonlabored Cardiac heart sounds are normal. No murmurs. She does not appear to be dehydrated.  Abdomen is distended and nontender.  Impression/plan #1 intractable seizures; the frequency of her seizures is never really clear as some of them appear to be unwitnessed nonetheless there is not a major issue here #2 I suspect recurrent aspiration  pneumonitis. She has a history of gastroesophageal reflux disease as well on omeprazole #3 macrocytosis without anemia and a normal RDW. I think a workup of this would be largely academic in this case. B12 level and folate level have been checked and were normal #4 edema I suspect this is related to her overall debilitated neurologic status. She has minimal edema today. She previously had a 2-D echo that was negative for heart failure BNP was normal. Compression stockings ordered but the not really 100% compliance

## 2014-09-19 ENCOUNTER — Non-Acute Institutional Stay (SKILLED_NURSING_FACILITY): Payer: PRIVATE HEALTH INSURANCE | Admitting: Internal Medicine

## 2014-09-19 DIAGNOSIS — F79 Unspecified intellectual disabilities: Secondary | ICD-10-CM

## 2014-09-19 DIAGNOSIS — R569 Unspecified convulsions: Secondary | ICD-10-CM

## 2014-09-19 NOTE — Progress Notes (Signed)
Patient ID: Ashley Haley, female   DOB: [birth year], 53 y.o.   MRN: [id number]      Facility; [facility] Chief complaint; review of medical issues/monthly [employer]  History; this is a now 53 year old woman who was admitted to the facility in April 2013. 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 She has not had a recent medical issues. I saw her earlier several months ago after having a generalized seizure. She apparently went on to have 1 more that evening and another one that morning. I do not see the lab work I ordered. She was given an order for Ativan. She is on Trileptal 2 tablets 1200 twice a day, Keppra 1500 mg twice daily and valproic acid sprinkles 625 twice a day  She was reported recently to have a low-grade fever. Chest x-ray urinalysis were negative.  Lab work; CBC with differential essentially normal Basic metabolic panel sodium 135 potassium 4.3 BUN 6 creatinine of 0.33     Past medical history/problem. #1 profound mental retardation. #2 seizure disorders. #3 gastroesophageal reflux.  Medication list is reviewed;  Vitamin C 3000 units daily Prilosec 20 daily Claritin 10 daily Colace 100 twice a day Trileptal 600 tablets 1200 mg twice a day Keppra 1500 twice a day Depakote 125 5 capsules 625 mg twice daily Flonase 51 spray in each nostril twice daily Klonopin 0.5 3 times daily Lipitor 10 daily  Social history; she has been changed to a DO NOT RESUSCITATE. Also has a do not hospitalize status as of November 2014  Physical exam; Vitals: Temperature 97.1-pulse 68-respirations 28-blood pressure 108/74-weight 226 pounds Gen. Appears to be in no distress Respiratory; . Shallow breathing bilaterally however her respirations are nonlabored Cardiac heart sounds are normal. No murmurs. She does not appear to be dehydrated.  Abdomen is distended and nontender.  Impression/plan #1 intractable seizures; the frequency of her seizures is never  really clear as some of them appear to be unwitnessed nonetheless there is not a major issue here #2 I suspect recurrent aspiration pneumonitis. She has a history of gastroesophageal reflux disease as well on omeprazole #3 macrocytosis without anemia and a normal RDW. I think a workup of this would be largely academic in this case. B12 level and folate level have been checked and were normal #4 edema I suspect this is related to her overall debilitated neurologic status. She has minimal edema today. She previously had a 2-D echo that was negative for heart failure BNP was normal. Compression stockings ordered but the not really 100% compliance

## 2014-10-17 ENCOUNTER — Non-Acute Institutional Stay (SKILLED_NURSING_FACILITY): Payer: PRIVATE HEALTH INSURANCE | Admitting: Internal Medicine

## 2014-10-17 DIAGNOSIS — F72 Severe intellectual disabilities: Secondary | ICD-10-CM

## 2014-10-17 DIAGNOSIS — R569 Unspecified convulsions: Secondary | ICD-10-CM

## 2014-10-17 NOTE — Progress Notes (Signed)
Patient ID: Ashley Haley, female   DOB: 1961-04-29, 53 y.o.   MRN: [id number]       Facility; [facility] Chief complaint; review of medical issues/monthly [employer]  History; this is a now 53 year old woman who was admitted to the facility in April 2013. 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 She has not had a recent medical issues. I saw her earlier several months ago after having a generalized seizure. She apparently went on to have 1 more that evening and another one that morning. I do not see the lab work I ordered. She was given an order for Ativan. She is on Trileptal 2 tablets 1200 twice a day, Keppra 1500 mg twice daily and valproic acid sprinkles 625 twice a day  There has been no recent issues by review of the record  Lab work; CBC with differential essentially normal Basic metabolic panel sodium 135 potassium 4.3 BUN 6 creatinine of 0.33     Past medical history/problem. #1 profound mental retardation. #2 seizure disorders. #3 gastroesophageal reflux.  Medication list is reviewed;  Vitamin C 3000 units daily Prilosec 20 daily Claritin 10 daily Colace 100 twice a day Trileptal 600 tablets 1200 mg twice a day Keppra 1500 twice a day Depakote 125 5 capsules 625 mg twice daily Flonase 51 spray in each nostril twice daily Klonopin 0.5 3 times daily Lipitor 10 daily  Social history; she has been changed to a DO NOT RESUSCITATE. Also has a do not hospitalize status as of November 2014  Physical exam; Vitals: Temperature 97.1-pulse 68-respirations 28-blood pressure 108/74-weight 226 pounds Gen. Appears to be in no distress Respiratory; . Shallow breathing bilaterally however her respirations are nonlabored Cardiac heart sounds are normal. No murmurs. She does not appear to be dehydrated.  Abdomen is distended and nontender.  Impression/plan #1 intractable seizures; the frequency of her seizures is never really clear as some of them  appear to be unwitnessed nonetheless there is not a major issue here #2 I suspect recurrent aspiration . She has a history of gastroesophageal reflux disease as well on omeprazole #3 macrocytosis without anemia and a normal RDW. I think a workup of this would be largely academic in this case. B12 level and folate level have been checked and were normal #4 edema I suspect this is related to her overall debilitated neurologic status. She has minimal edema today. She previously had a 2-D echo that was negative for heart failure BNP was normal. Compression stockings ordered but the not really 100% compliance

## 2014-11-10 ENCOUNTER — Non-Acute Institutional Stay (SKILLED_NURSING_FACILITY): Payer: PRIVATE HEALTH INSURANCE | Admitting: Internal Medicine

## 2014-11-10 DIAGNOSIS — G40309 Generalized idiopathic epilepsy and epileptic syndromes, not intractable, without status epilepticus: Secondary | ICD-10-CM

## 2014-11-10 DIAGNOSIS — F79 Unspecified intellectual disabilities: Secondary | ICD-10-CM

## 2014-11-13 NOTE — Progress Notes (Addendum)
Patient ID: Ashley Haley, female   DOB: 1961/10/11, 53 y.o.   MRN: [id number]              PROGRESS NOTE  DATE:  11/10/2014    FACILITY: [facility]    LEVEL OF CARE:   SNF   Routine Visit   CHIEF COMPLAINT:  Review of medical issues, [employer] visit.    HISTORY OF PRESENT ILLNESS:  This is a now 53 year-old lady who was admitted to the facility in April 2013.  She has profound retardation and intractable epilepsy.  At one point during the early part of this year, I really thought these were out of control.  However, as far as I am aware, she has not had complaints of uncontrolled seizures.    I have also been concerned about gastroesophageal reflux and possible aspiration, although this even appears to be stable.    PAST MEDICAL HISTORY/PROBLEM LIST:    Profound mental retardation.    Seizure disorder.    Gastroesophageal reflux disease.    Probable aspiration.    CURRENT MEDICATIONS:  Medication list is reviewed.      Prilosec 20 q.d.    Claritin 10 q.d.    Colace 10 q.d.    Trileptal 1200 twice daily.    Keppra 1000 plus 500/1500 twice daily.    Depakote 125 mg, 5 capsules/625 b.i.d.    Flonase 50, 1 spray alternating nostrils.    Senokot 8.6 twice daily.    Klonopin 0.5 three times a day.    Lipitor 10 q.d.    Vitamin D3, 50,000 U monthly.    PHYSICAL EXAMINATION:   VITAL SIGNS:   TEMPERATURE:  98.4.   PULSE:  80.   RESPIRATIONS:  20.   BLOOD PRESSURE:  122/74.   WEIGHT:  227 pounds.   CHEST/RESPIRATORY:  Clear air entry bilaterally.   CARDIOVASCULAR:  CARDIAC:   Heart sounds are normal.   GASTROINTESTINAL:  ABDOMEN:   Soft.  No tenderness.    ASSESSMENT/PLAN:     Severe mental retardation.    Intractable seizures.  As far as I am aware, this appears to be under better control.    Gastroesophageal reflux disease with probable recurrent aspiration.  She continues on omeprazole.

## 2014-12-03 ENCOUNTER — Other Ambulatory Visit: Payer: Self-pay | Admitting: *Deleted

## 2014-12-03 ENCOUNTER — Non-Acute Institutional Stay (SKILLED_NURSING_FACILITY): Payer: PRIVATE HEALTH INSURANCE | Admitting: Internal Medicine

## 2014-12-03 DIAGNOSIS — IMO0002 Reserved for concepts with insufficient information to code with codable children: Secondary | ICD-10-CM

## 2014-12-03 DIAGNOSIS — J690 Pneumonitis due to inhalation of food and vomit: Secondary | ICD-10-CM

## 2014-12-03 DIAGNOSIS — Z66 Do not resuscitate: Secondary | ICD-10-CM

## 2014-12-03 DIAGNOSIS — J189 Pneumonia, unspecified organism: Secondary | ICD-10-CM

## 2014-12-03 MED ORDER — LORAZEPAM 0.5 MG PO TABS: ORAL_TABLET | ORAL | Status: DC

## 2014-12-03 NOTE — Telephone Encounter (Signed)
[facility] 

## 2014-12-05 NOTE — Progress Notes (Addendum)
Patient ID: Ashley Haley, female   DOB: 10/23/1961, 53 y.o.   MRN: [id number]               PROGRESS NOTE  DATE:  12/03/2014     FACILITY: [facility]    LEVEL OF CARE:   SNF   Acute Visit   CHIEF COMPLAINT:  Respiratory distress.    HISTORY OF PRESENT ILLNESS:  The patient has not been doing well.  She apparently was felt to have fever and a right lower lobe pneumonia based on a chest x-ray on Friday.  She was put on antibiotics, Rocephin, starting on 11/30/2014.  Apparently, repeat chest x-ray did not show that infiltrate.  She had lab work showing a white count of 9 with a normal differential.  Sodium was 146, CO2 of 42, BUN of 9, creatinine of 0.37.    She has not generally done well over the weekend.  She has been on routine nebulizers.  She was given a shot of Solu-Medrol on 12/02/2014.    Today, her O2 sats are still in the low 70s on room air.  She is a No Code and No Hospital Transfer.    PHYSICAL EXAMINATION:   VITAL SIGNS:   O2 SATURATIONS:  95% on 2 L.   PULSE:  86.   RESPIRATIONS:  22.   GENERAL APPEARANCE:  The patient is lying in bed, restless and awake.   CHEST/RESPIRATORY:  Markedly decreased air entry over the right lower lobe.  The left lung is clearer.  There is audible wheezing.   CARDIOVASCULAR:  CARDIAC:  Heart sounds are distant.  No increase in jugular venous pressure.   EDEMA/VARICOSITIES:  There is scant peripheral edema.    ASSESSMENT/PLAN:                         Recurrent aspiration pneumonitis.  I am a bit surprised that the last chest x-ray did not show anything over the right lower lobe.  She continues on Rocephin.  She is hypoxemic.  The [employer] nurse practitioner has ordered BiPAP, which is reasonable.  However, I have my doubts that she will actually wear this.    Elevated total bicarb on a basic metabolic panel.  This could represent an attempt at compensation for the elevated pCO2 or could reflect early volume contraction (contraction  alkalosis).    Recurrent aspiration.  Not a new phenomenon.  As mentioned, she is limited as to what we can do in the facility.

## 2014-12-09 DIAGNOSIS — J690 Pneumonitis due to inhalation of food and vomit: Secondary | ICD-10-CM | POA: Insufficient documentation

## 2014-12-09 DIAGNOSIS — Z66 Do not resuscitate: Secondary | ICD-10-CM | POA: Insufficient documentation

## 2015-01-16 ENCOUNTER — Non-Acute Institutional Stay (SKILLED_NURSING_FACILITY): Payer: Medicare Other | Admitting: Internal Medicine

## 2015-01-16 DIAGNOSIS — J690 Pneumonitis due to inhalation of food and vomit: Secondary | ICD-10-CM

## 2015-01-16 DIAGNOSIS — F79 Unspecified intellectual disabilities: Secondary | ICD-10-CM

## 2015-01-16 DIAGNOSIS — G40309 Generalized idiopathic epilepsy and epileptic syndromes, not intractable, without status epilepticus: Secondary | ICD-10-CM

## 2015-01-16 DIAGNOSIS — IMO0002 Reserved for concepts with insufficient information to code with codable children: Secondary | ICD-10-CM

## 2015-01-16 NOTE — Progress Notes (Signed)
Patient ID: Ashley [name], female   DOB: February 11, 1961, 54 y.o.   MRN: [id number]              PROGRESS NOTE  DATE:  01/16/2015   FACILITY: [facility]    LEVEL OF CARE:   SNF   Routine Visit   CHIEF COMPLAINT:  Review of medical issues, [employer] visit.    HISTORY OF PRESENT ILLNESS:  This is a now 54 year-old lady who was admitted to the facility in April 2013.  She has profound retardation and intractable epilepsy.  At one point during the early part of this year, I really thought these were out of control.  However, as far as I am aware, she has not had complaints of uncontrolled seizures.    She was treated last month for a LLL pneumonia felt to be secondary to aspiration. She was hypoxic and required O2 an Bipap  PAST MEDICAL HISTORY/PROBLEM LIST:    Profound mental retardation.    Seizure disorder.    Gastroesophageal reflux disease.    Probable aspiration.    CURRENT MEDICATIONS:  Medication list is reviewed.      Prilosec 20 q.d.    Claritin 10 q.d.    Colace 10 q.d.    Trileptal 1200 twice daily.    Keppra 1000 plus 500/1500 twice daily.    Depakote 125 mg, 5 capsules/625 b.i.d.    Flonase 50, 1 spray alternating nostrils.    Senokot 8.6 twice daily.    Klonopin 0.5 three times a day.    Lipitor 10 q.d.    Vitamin D3, 50,000 U monthly.   Social: she is DNR and no hospitalizations.     PHYSICAL EXAMINATION:   VITAL SIGNS:   TEMPERATURE:  97.6.   PULSE:  80.   RESPIRATIONS:  20. O2 sat 90% on 2l BLOOD PRESSURE:  120/68  WEIGHT:  228 pounds.   CHEST/RESPIRATORY:  Clear air entry bilaterally.   CARDIOVASCULAR:  CARDIAC:   Heart sounds are normal.   GASTROINTESTINAL:  ABDOMEN:   Soft.  No tenderness.    ASSESSMENT/PLAN:     Severe mental retardation.    Intractable seizures.  As far as I am aware, this appears to be under better control.    Gastroesophageal reflux disease with probable recurrent aspiration.  She continues on  omeprazole.  She seems continuously hypoxic, the etiology is not completely clear. However she is largely comfort care, no aggressive investigations.

## 2015-02-13 ENCOUNTER — Non-Acute Institutional Stay (SKILLED_NURSING_FACILITY): Payer: Medicare Other | Admitting: Internal Medicine

## 2015-02-13 DIAGNOSIS — G40309 Generalized idiopathic epilepsy and epileptic syndromes, not intractable, without status epilepticus: Secondary | ICD-10-CM | POA: Diagnosis not present

## 2015-02-13 DIAGNOSIS — F79 Unspecified intellectual disabilities: Secondary | ICD-10-CM

## 2015-02-17 NOTE — Progress Notes (Addendum)
Patient ID: Ashley Haley, female   DOB: 06-11-1961, 54 y.o.   MRN: [id number]                PROGRESS NOTE  DATE:  02/13/2015           FAC[facility]           LEVEL OF CARE:   SNF   Routine Visit                                     CHIEF COMPLAINT:  Routine medical visit/[employer] visit.      HISTORY OF PRESENT ILLNESS:  [name] is a patient with advanced mental retardation and also seizure disorder.    She has been declining.  I think she probably aspirates/chokes with meals.    She has not had any recent problems with seizures.    She is essentially comfort care in the facility.    CURRENT MEDICATIONS:  Medication list is reviewed.     Prilosec 20 q.d.       Claritin 10 q.d.      Colace 100 b.i.d.       Trileptal 1200 twice daily.    Keppra 1500 twice daily.    Depakote 625 twice daily.    Flonase 50 mcg each nostril twice daily.     Senokot b.i.d.      Klonopin 0.5 three times a day.    PHYSICAL EXAMINATION:   VITAL SIGNS:   RESPIRATIONS:    20.     PULSE:     78.     BLOOD PRESSURE:  126/70.     CHEST/RESPIRATORY:  Clear air entry bilaterally.    CARDIOVASCULAR:   CARDIAC:  Heart sounds are normal.  There are no murmurs.     ASSESSMENT/PLAN:                                  Idiopathic epilepsy.  She is on oxcarbazepine, Keppra, and Depakote.  She had one seizure earlier this week.  She is asymptomatic today.    Severe mental retardation.  I think she is aspirating largely here.  Otherwise, right now she appears to be stable.  We have had significant problems in the past with aspiration pneumonitis.

## 2015-02-26 ENCOUNTER — Encounter (HOSPITAL_COMMUNITY)
Admission: RE | Admit: 2015-02-26 | Discharge: 2015-02-26 | Disposition: A | Discharge status: Home / Self Care | Payer: Medicare Other | Source: Ambulatory Visit | Attending: Internal Medicine | Admitting: Internal Medicine

## 2015-02-27 ENCOUNTER — Non-Acute Institutional Stay (SKILLED_NURSING_FACILITY): Payer: Medicare Other | Admitting: Internal Medicine

## 2015-02-27 DIAGNOSIS — R410 Disorientation, unspecified: Secondary | ICD-10-CM

## 2015-03-01 ENCOUNTER — Inpatient Hospital Stay (HOSPITAL_COMMUNITY): Admit: 2015-03-01 | Payer: Self-pay

## 2015-03-03 ENCOUNTER — Emergency Department (HOSPITAL_COMMUNITY)
Admission: EM | Admit: 2015-03-03 | Discharge: 2015-03-03 | Disposition: A | Discharge status: Home / Self Care | Payer: Medicare Other | Attending: Emergency Medicine | Admitting: Emergency Medicine

## 2015-03-03 ENCOUNTER — Emergency Department (HOSPITAL_COMMUNITY): Payer: Medicare Other

## 2015-03-03 ENCOUNTER — Encounter (HOSPITAL_COMMUNITY): Payer: Self-pay | Admitting: Emergency Medicine

## 2015-03-03 DIAGNOSIS — G40909 Epilepsy, unspecified, not intractable, without status epilepticus: Secondary | ICD-10-CM | POA: Diagnosis not present

## 2015-03-03 DIAGNOSIS — Z7951 Long term (current) use of inhaled steroids: Secondary | ICD-10-CM | POA: Insufficient documentation

## 2015-03-03 DIAGNOSIS — Z79899 Other long term (current) drug therapy: Secondary | ICD-10-CM | POA: Insufficient documentation

## 2015-03-03 DIAGNOSIS — Z87820 Personal history of traumatic brain injury: Secondary | ICD-10-CM | POA: Diagnosis not present

## 2015-03-03 DIAGNOSIS — Z862 Personal history of diseases of the blood and blood-forming organs and certain disorders involving the immune mechanism: Secondary | ICD-10-CM | POA: Insufficient documentation

## 2015-03-03 DIAGNOSIS — R4182 Altered mental status, unspecified: Secondary | ICD-10-CM | POA: Diagnosis not present

## 2015-03-03 DIAGNOSIS — Z87440 Personal history of urinary (tract) infections: Secondary | ICD-10-CM | POA: Diagnosis not present

## 2015-03-03 HISTORY — DX: Urinary tract infection, site not specified: N39.0

## 2015-03-03 HISTORY — DX: Borderline personality disorder: F60.3

## 2015-03-03 HISTORY — DX: Difficulty in walking, not elsewhere classified: R26.2

## 2015-03-03 HISTORY — DX: Iron deficiency anemia, unspecified: D50.9

## 2015-03-03 HISTORY — DX: Epilepsy, unspecified, not intractable, without status epilepticus: G40.909

## 2015-03-03 LAB — COMPREHENSIVE METABOLIC PANEL
ALBUMIN: 2.9 g/dL — AB (ref 3.5–5.2)
ALK PHOS: 47 U/L (ref 39–117)
ALT: 11 U/L (ref 0–35)
AST: 13 U/L (ref 0–37)
Anion gap: 7 (ref 5–15)
BILIRUBIN TOTAL: 0.4 mg/dL (ref 0.3–1.2)
BUN: 17 mg/dL (ref 6–23)
CO2: 41 mmol/L — AB (ref 19–32)
Calcium: 8.7 mg/dL (ref 8.4–10.5)
Chloride: 101 mmol/L (ref 96–112)
Creatinine, Ser: 0.35 mg/dL — ABNORMAL LOW (ref 0.50–1.10)
GFR calc non Af Amer: >90 mL/min (ref >90)
Glucose, Bld: 94 mg/dL (ref 70–99)
POTASSIUM: 3.5 mmol/L (ref 3.5–5.1)
Sodium: 149 mmol/L — ABNORMAL HIGH (ref 135–145)
Total Protein: 6.8 g/dL (ref 6.0–8.3)

## 2015-03-03 LAB — CBC
HCT: 40.3 % (ref 36.0–46.0)
HEMOGLOBIN: 12 g/dL (ref 12.0–15.0)
MCH: 32.3 pg (ref 26.0–34.0)
MCHC: 29.8 g/dL — ABNORMAL LOW (ref 30.0–36.0)
MCV: 108.6 fL — AB (ref 78.0–100.0)
PLATELETS: 277 10*3/uL (ref 150–400)
RBC: 3.71 MIL/uL — ABNORMAL LOW (ref 3.87–5.11)
RDW: 14.5 % (ref 11.5–15.5)
WBC: 7.6 10*3/uL (ref 4.0–10.5)

## 2015-03-03 LAB — URINALYSIS, ROUTINE W REFLEX MICROSCOPIC
GLUCOSE, UA: NEGATIVE mg/dL
Hgb urine dipstick: NEGATIVE
KETONES UR: NEGATIVE mg/dL
LEUKOCYTES UA: NEGATIVE
Nitrite: NEGATIVE
PROTEIN: NEGATIVE mg/dL
Urobilinogen, UA: 0.2 mg/dL (ref 0.0–1.0)
pH: 6 (ref 5.0–8.0)

## 2015-03-03 LAB — CBG MONITORING, ED: Glucose-Capillary: 83 mg/dL (ref 70–99)

## 2015-03-03 MED ORDER — SODIUM CHLORIDE 0.9 % IV BOLUS (SEPSIS)
500.0000 mL | Freq: Once | INTRAVENOUS | Status: AC
  Administered 2015-03-03: 500 mL via INTRAVENOUS

## 2015-03-03 MED ORDER — SODIUM CHLORIDE 0.9 % IV BOLUS (SEPSIS)
500.0000 mL | Freq: Once | INTRAVENOUS | Status: DC
Start: 2015-03-03 — End: 2015-03-03

## 2015-03-03 NOTE — ED Provider Notes (Signed)
CSN: [id number]     Arrival date & time 03/03/15  4098 History  This chart was scribed for Donnetta Hutching, MD by Ronney Lion, ED Scribe. This patient was seen in room APA18/APA18 and the patient's care was started at 8:35 AM.   Chief Complaint  Patient presents with  . Altered Mental Status   The history is provided by the nursing home. The history is limited by the condition of the patient. No language interpreter was used.    LEVEL 5 CAVEAT DUE TO MENTAL RETARDATION AND AMS  HPI Comments: Moira Umholtz is a 54 y.o. female who presents to the Emergency Department for AMS. Patient is from [facility] and reports that she has been declining for 2 weeks, with her O2 saturation in the 70s at times. Patient is now being treated for pneumonia with Rocephin IM and Cefdinir. She is normally on 2 L O2. She usually eats well, but has not been eating recently. Patient has also been leaving on her BiPAP at night for the past few days, which she normally takes off. She is usually shaking her head and neck during seizures, and showed some "seizure-like" activity for the past 2 days. Yesterday, her eyes seemed fixed.   Patient is non-communicative, which is baseline for her.   Past Medical History  Diagnosis Date  . Mental retardation   . TBI (traumatic brain injury)   . Seizures   . Difficulty walking   . Urinary tract infection   . Iron deficiency anemia   . Explosive personality disorder   . Epilepsy    Past Surgical History  Procedure Laterality Date  . Unable to obtain     No family history on file. History  Substance Use Topics  . Smoking status: Never Smoker   . Smokeless tobacco: Not on file  . Alcohol Use: No   OB History    No data available     Review of Systems  Unable to perform ROS: Patient nonverbal    Allergies  Review of patient's allergies indicates no known allergies.  Home Medications   Prior to Admission medications   Medication Sig Start Date End Date Taking?  Authorizing Provider  atorvastatin (LIPITOR) 20 MG tablet Take 20 mg by mouth every evening.   Yes Historical Provider, MD  cefdinir (OMNICEF) 300 MG capsule Take 300 mg by mouth every 12 (twelve) hours. For 5 days(started 02/28/15 to 03/04/15)   Yes Historical Provider, MD  cefTRIAXone (ROCEPHIN) 1 G injection Inject 1 g into the muscle once.   Yes Historical Provider, MD  cefTRIAXone (ROCEPHIN) 1 G injection Inject 1 g into the muscle daily.   Yes Historical Provider, MD  clonazePAM (KLONOPIN) 0.5 MG tablet Take 1 tablet three times a day Patient taking differently: Take 0.5 mg by mouth 3 (three) times daily.  05/28/14  Yes Tiffany L Reed, DO  divalproex (DEPAKOTE SPRINKLE) 125 MG capsule Take 625 mg by mouth 2 (two) times daily.   Yes Historical Provider, MD  docusate sodium (COLACE) 100 MG capsule Take 100 mg by mouth 2 (two) times daily.   Yes Historical Provider, MD  Eyelid Cleansers (OCUSOFT LID SCRUB) PADS Apply 1 each topically daily. Wait 3-5 mins.between 2 eye meds.   Yes Historical Provider, MD  fluticasone (FLONASE) 50 MCG/ACT nasal spray Place 1 spray into both nostrils 2 (two) times daily.    Yes Historical Provider, MD  ipratropium-albuterol (DUONEB) 0.5-2.5 (3) MG/3ML SOLN Take 3 mLs by nebulization every 6 (six)  hours.   Yes Historical Provider, MD  levETIRAcetam (KEPPRA) 1000 MG tablet Take 1,000 mg by mouth 2 (two) times daily. Take along with 500mg  to equal 1500mg    Yes Historical Provider, MD  levETIRAcetam (KEPPRA) 500 MG tablet Take 500 mg by mouth 2 (two) times daily. Take with keppra 1000mg  to equal 1500mg    Yes Historical Provider, MD  loratadine (CLARITIN) 10 MG tablet Take 10 mg by mouth daily.   Yes Historical Provider, MD  omeprazole (PRILOSEC) 20 MG capsule Take 20 mg by mouth daily.   Yes Historical Provider, MD  oxcarbazepine (TRILEPTAL) 600 MG tablet Take 1,200 mg by mouth 2 (two) times daily.   Yes Historical Provider, MD  predniSONE (STERAPRED UNI-PAK) 10 MG tablet  Take 1 tablet by mouth as directed. 6 day dose pack(See MAR)   Yes Historical Provider, MD  Probiotic Product (PROBIOTIC DAILY PO) Take 1 capsule by mouth 2 (two) times daily.   Yes Historical Provider, MD  senna (SENOKOT) 8.6 MG TABS tablet Take 1 tablet by mouth 2 (two) times daily.   Yes Historical Provider, MD  LORazepam (ATIVAN) 0.5 MG tablet Take one tablet by mouth every eight hours for respirations greater than 24 for 3 days Patient not taking: Reported on 03/03/2015 12/03/14   Tiffany L Reed, DO  LORazepam (ATIVAN) 2 MG/ML injection Administer 0.3075ml intramuscularly for 1 dose as needed for seizure; may repeat 0.75 for 1 dose in 10-15 minutes as needed and call MD 01/26/14   Tiffany L Reed, DO   BP 132/72 mmHg  Pulse 66  Resp 23  SpO2 96%  LMP  (LMP Unknown) Physical Exam  Constitutional: She appears well-developed and well-nourished.  Not responsive to questions.  HENT:  Head: Normocephalic and atraumatic.  Mucus membranes somewhat dry.   Eyes: Conjunctivae and EOM are normal. Pupils are equal, round, and reactive to light.  Neck: Normal range of motion. Neck supple.  Cardiovascular: Normal rate and regular rhythm.   Pulmonary/Chest: Effort normal and breath sounds normal.  Abdominal: Soft. Bowel sounds are normal.  Musculoskeletal:  Unable  Neurological:  Unable  Skin: Skin is warm and dry.  Psychiatric:  Unable  Nursing note and vitals reviewed.   ED Course  Procedures (including critical care time)  COORDINATION OF CARE: 8:40 AM - Treatment plan includes blood tests and IV.   Labs Review Labs Reviewed  CBC - Abnormal; Notable for the following:    RBC 3.71 (*)    MCV 108.6 (*)    MCHC 29.8 (*)    All other components within normal limits  COMPREHENSIVE METABOLIC PANEL - Abnormal; Notable for the following:    Sodium 149 (*)    CO2 41 (*)    Creatinine, Ser 0.35 (*)    Albumin 2.9 (*)    All other components within normal limits  URINALYSIS, ROUTINE W  REFLEX MICROSCOPIC - Abnormal; Notable for the following:    APPearance HAZY (*)    Specific Gravity, Urine >1.030 (*)    Bilirubin Urine SMALL (*)    All other components within normal limits  CBG MONITORING, ED   Imaging Review Dg Chest Port 1 View  03/03/2015   CLINICAL DATA:  Seizure like activity. Non community tip. History of dramatic brain injury. Mental retardation.  EXAM: PORTABLE CHEST - 1 VIEW  COMPARISON:  None.  FINDINGS: Lungs are suboptimally inflated. No pleural effusion or edema. Heart size appears enlarged but this may reflect portable AP technique.  IMPRESSION: 1. Low  lung volumes.   Electronically Signed   By: Signa Kell M.D.   On: 03/03/2015 09:42     EKG Interpretation   Date/Time:  Sunday March 03 2015 08:21:45 EDT Ventricular Rate:  74 PR Interval:  187 QRS Duration: 103 QT Interval:  439 QTC Calculation: 487 R Axis:   -30 Text Interpretation:  Sinus rhythm Left axis deviation Abnormal R-wave  progression, late transition Borderline prolonged QT interval Confirmed by  Adriana Simas  MD, Yunis Voorheis ([id number]) on 03/03/2015 9:28:43 AM      MDM   Final diagnoses:  Altered mental state   patient is extremely impaired from multiple health problems. She is in the DO NOT RESUSCITATE category. Screening tests including urinalysis and chest x-ray showed no life-threatening issues. She was given IV hydration in the emergency department. Vital signs were stable. Discussed with nursing home facility. They are willing to take her back to the nursing home   I personally performed the services described in this documentation, which was scribed in my presence. The recorded information has been reviewed and is accurate.   Donnetta Hutching, MD 03/03/15 1201

## 2015-03-03 NOTE — ED Notes (Signed)
[facility]oned [facility] again regarding Code status. Nurse states Code Status is DNR. Facility did not send the DNR form with her to this visit.

## 2015-03-03 NOTE — ED Notes (Signed)
[facility]. Staff reported to EMS that pt has had "seizure-like" activity with "convulsing of the head and neck x 2 days" Reports pt is gazing upward more than often and she is leaving her CPAP on at night, which she usually takes it off every night. States that pts O2 has been dropping in the 70's and she is on O2 at all times. Pt does not normally walk or talk. [facility] to get additional information.

## 2015-03-03 NOTE — ED Notes (Signed)
Spoke with Morton Petersenee Toney LPN at [facility] who took report on pt and stated that she was fine to come back to facility as her pcp is aware that she is basically comfort care at this point.  Requested MD to write for IV to remain in place as they were unsuccessful multiple times prior to pt's arrival to ED.  Advised Dr Adriana Simasook.

## 2015-03-03 NOTE — ED Notes (Signed)
[facility] to [facility].  Nurse will call back.

## 2015-03-03 NOTE — Discharge Instructions (Signed)
Urinalysis shows no obvious infection. Chest x-ray shows nothing serious. IV remains in place. You can use this for IV hydration at your facility. Follow-up with her primary care doctor.

## 2015-03-03 NOTE — ED Notes (Signed)
[facility] and spoke with nurse there. Nurse states that the pts O2  Has been dropping to the 70's at times, unable to get above 90%, "even when on BiPap"at night" Nurse also states, "Eyes were fixed at one point yesterday", stating she has seizures with only activity with her head and neck. Pt is currently being treated for pneumonia and has been receiving Rocephin IM for the past couple of days, stating they have been unable to get an IV. Pt is also on Cefdinir. Nurse also states pt is not eating well, which is very ususual for the patient.

## 2015-03-04 ENCOUNTER — Non-Acute Institutional Stay (SKILLED_NURSING_FACILITY): Payer: Medicare Other | Admitting: Internal Medicine

## 2015-03-04 DIAGNOSIS — E440 Moderate protein-calorie malnutrition: Secondary | ICD-10-CM | POA: Diagnosis not present

## 2015-03-04 DIAGNOSIS — E870 Hyperosmolality and hypernatremia: Secondary | ICD-10-CM | POA: Diagnosis not present

## 2015-03-04 DIAGNOSIS — IMO0002 Reserved for concepts with insufficient information to code with codable children: Secondary | ICD-10-CM

## 2015-03-04 DIAGNOSIS — J690 Pneumonitis due to inhalation of food and vomit: Secondary | ICD-10-CM

## 2015-03-04 NOTE — Progress Notes (Signed)
Patient ID: Ashley Haley, female   DOB: 15-Aug-1961, 54 y.o.   MRN: [id number]                PROGRESS NOTE  DATE:  02/27/2015               FACILITY[facility]        LEVEL OF CARE:   SNF   Acute Visit                                      CHIEF COMPLAINT:  Altered status, ?pneumonia.      HISTORY OF PRESENT ILLNESS:  I was notified today that the patient simply did not look well.  She was felt to have perhaps aspiration pneumonia, although a chest x-ray is clear.  I am not aware that she had a fever.  She has been started already on Rocephin and now cefdinir 300 q.12 for pneumonia.  I am seeing her today in follow-up of this.    The patient has advanced mental retardation and a history of frequent seizures.    She is DNR and No Hospitalizations.     PHYSICAL EXAMINATION:   VITAL SIGNS:   O2 SATURATIONS:  94% on 2 L.   RESPIRATIONS:    20.    PULSE:     70.   GENERAL APPEARANCE:  I certainly see what the staff is seeing.  She simply does not look herself.  Looks fatigued, perhaps some low-grade temperature.   CHEST/RESPIRATORY:  Shallow, but otherwise clear air entry bilaterally.   Her work of breathing does not appear to be abnormal.   CARDIOVASCULAR:   CARDIAC:  Heart sounds are normal.  She appears to be euvolemic.      GASTROINTESTINAL:   ABDOMEN:  Very distended and quiet.  There is no guarding or rebound, or clear masses.     GENITOURINARY:   BLADDER:  There is suprapubic tenderness.    ASSESSMENT/PLAN:                             ?Delirium on top of background mental retardation.  I agree with the antibiotic choice.  I would think this is more likely to be a UTI than pneumonia, although it is very difficult to tell.  Lab work is pending.  I have reviewed the orders and agree with the empiric antibiotic choice.  Asusuaul it is possible she is post ictal with an unobserved seizure

## 2015-03-07 NOTE — Progress Notes (Addendum)
Patient ID: Ashley Haley, female   DOB: 11/11/1961, 54 y.o.   MRN: [id number]                PROGRESS NOTE  DATE:  03/04/2015          FACILITY[facility]                   LEVEL OF CARE:   SNF   Acute Visit                                     CHIEF COMPLAINT:  Review of trip to the ER yesterday.      HISTORY OF PRESENT ILLNESS:  Apparently, the patient's brother came to see this patient yesterday.  There was some concern expressed by the staff and she ended up in the ER.    Things have not been going particularly well for the patient in general.  She was found to be febrile last week.  A chest x-ray was done that was negative.  It has been my general feeling that she is probably recurrently aspirating.    In the ER, she had a comprehensive metabolic panel showing an albumin of 2.9 and a sodium of 149.  Her BUN was normal at 17, creatinine 0.35.    Her liver function tests were normal.    CBC showed a white count of 7.6, hemoglobin of 12.  Noted that her MCV was 108.6.    A urinalysis was negative.    A portable chest x-ray showed low lung volumes, but nothing on a single portable view.    She was returned to the facility.  She received IV fluids.    PHYSICAL EXAMINATION:   GENERAL APPEARANCE:   The patient is sitting up in a Geri-chair.  Thrashing her head from side to side.  This is not an ab normal behavior.   CHEST/RESPIRATORY:  As far as I can hear, shallow but otherwise clear.      CARDIOVASCULAR:   CARDIAC:  Heart sounds are normal.  She does not appear to be dehydrated.      GASTROINTESTINAL:   ABDOMEN:  Obese and somewhat distended.  No masses.   LIVER/SPLEEN/KIDNEYS:  No liver, no spleen.    ASSESSMENT/PLAN:                                           Hypernatremia.  This may be indicative of a progressive decline secondary to poor oral intake.  Her albumin is also 2.9.      Recurrent aspiration.    I will try to research the circumstances for which the  patient was sent to the hospital.  She is a DNR and No Hospitalizations, per our records. Some of these issues  Are likely to be indicative of a preterminal state.

## 2015-04-17 ENCOUNTER — Non-Acute Institutional Stay (SKILLED_NURSING_FACILITY): Payer: Medicare Other | Admitting: Internal Medicine

## 2015-04-17 DIAGNOSIS — G40309 Generalized idiopathic epilepsy and epileptic syndromes, not intractable, without status epilepticus: Secondary | ICD-10-CM

## 2015-04-17 DIAGNOSIS — F79 Unspecified intellectual disabilities: Secondary | ICD-10-CM

## 2015-04-17 NOTE — Progress Notes (Signed)
Patient ID: Ashley Haley, female   DOB: 11-16-1961, 54 y.o.   MRN: [id number]                PROGRESS NOTE  DATE:  04/17/2015          [facility]           LEVEL OF CARE:   SNF   Routine Visit                                     CHIEF COMPLAINT:  Routine medical visit/[employer] visit.      HISTORY OF PRESENT ILLNESS:  [name] is a patient with advanced mental retardation and also seizure disorder.    She has been declining.  I think she probably aspirates/chokes with meals in fact the last 2 times I have seen her because of frank aspiration episodes.  She has not had any recent problems with seizures.    She is essentially comfort care in the facility.    Laboratory; on 3/18 Lipid panel LDL cholesterol 69, liver function tests normal except for an albumin at 3.3 On 3/8 CBC showed a white count of 12.1 hemoglobin of 12.9. Basic metabolic panel shows sodium of 146 a BUN of 11 and creatinine of 0.36  CURRENT MEDICATIONS:  Medication list is reviewed.     Prilosec 20 q.d.       Claritin 10 q.d.      Colace 100 b.i.d.       Trileptal 1200 twice daily.    Keppra 1500 twice daily.    Depakote 625 twice daily.    Flonase 50 mcg each nostril twice daily.     Senokot b.i.d.      Klonopin 0.5 three times a day.    PHYSICAL EXAMINATION:  Weight is 226 pounds VITAL SIGNS:   RESPIRATIONS:    20.     PULSE:     70.     BLOOD PRESSURE:  130/76     CHEST/RESPIRATORY:  Clear air entry bilaterally.    CARDIOVASCULAR:   CARDIAC:  Heart sounds are normal.  There are no murmurs.     ASSESSMENT/PLAN:                                  Idiopathic epilepsy.  She is on oxcarbazepine, Keppra, and Depakote.  No seizures recorded this month   Severe mental retardation.  I think she is aspirating largely here.  Otherwise, right now she appears to be stable.  We have had significant problems in the past with aspiration pneumonitis.

## 2015-04-29 ENCOUNTER — Other Ambulatory Visit: Payer: Self-pay | Admitting: *Deleted

## 2015-04-29 MED ORDER — CLONAZEPAM 0.5 MG PO TABS: ORAL_TABLET | ORAL | Status: DC

## 2015-04-29 NOTE — Telephone Encounter (Signed)
[facility] 

## 2015-05-08 ENCOUNTER — Non-Acute Institutional Stay (SKILLED_NURSING_FACILITY): Payer: Medicare Other | Admitting: Internal Medicine

## 2015-05-08 DIAGNOSIS — G40309 Generalized idiopathic epilepsy and epileptic syndromes, not intractable, without status epilepticus: Secondary | ICD-10-CM | POA: Diagnosis not present

## 2015-05-08 DIAGNOSIS — F79 Unspecified intellectual disabilities: Secondary | ICD-10-CM

## 2015-05-08 DIAGNOSIS — J690 Pneumonitis due to inhalation of food and vomit: Secondary | ICD-10-CM

## 2015-05-08 DIAGNOSIS — IMO0002 Reserved for concepts with insufficient information to code with codable children: Secondary | ICD-10-CM

## 2015-05-08 NOTE — Progress Notes (Signed)
Patient ID: Ashley DasSabrina Keltz, female   DOB: [birth year], 54 y.o.   MRN: [id number] P                PROGRESS NOTE  DATE:  05/08/2015         [facility]           LEVEL OF CARE:   SNF   Routine Visit                                     CHIEF COMPLAINT:  Routine medical visit/[employer] visit.      HISTORY OF PRESENT ILLNESS:  [name] is a patient with advanced mental retardation and also seizure disorder.    She has been declining gradually.  I think she probably aspirates/chokes with meals in fact the last 2 times I have seen her because of frank aspiration episodes.  She has not had any recent problems with seizures.    She is essentially comfort care in the facility.  There has not been any issues since last visitl  Laboratory; on 3/18 Lipid panel LDL cholesterol 69, liver function tests normal except for an albumin at 3.3 On 3/8 CBC showed a white count of 12.1 hemoglobin of 12.9. Basic metabolic panel shows sodium of 146 a BUN of 11 and creatinine of 0.36  CURRENT MEDICATIONS:  Medication list is reviewed.     Prilosec 20 q.d.       Claritin 10 q.d.      Colace 100 b.i.d.       Trileptal 1200 twice daily.    Keppra 1500 twice daily.    Depakote 625 twice daily.    Flonase 50 mcg each nostril twice daily.     Senokot b.i.d.      Klonopin 0.5 three times a day.    Senokot; 8.6bid  lipitor 20 qd  Vit D3 5000 u monthly    PHYSICAL EXAMINATION:  Weight is 226 pounds VITAL SIGNS:   RESPIRATIONS:    18  PULSE:     74.     BLOOD PRESSURE:  130/76     CHEST/RESPIRATORY:  Clear air entry bilaterally.    CARDIOVASCULAR:   CARDIAC:  Heart sounds are normal.  There are no murmurs.     ASSESSMENT/PLAN:                                  Idiopathic epilepsy.  She is on oxcarbazepine, Keppra, and Depakote.  No seizures recorded this month   Severe mental retardation.  I think she is aspirating recurrenlty.    She is largely comfort care in the facilty.

## 2015-06-15 ENCOUNTER — Non-Acute Institutional Stay (SKILLED_NURSING_FACILITY): Payer: Medicare Other | Admitting: Internal Medicine

## 2015-06-15 DIAGNOSIS — F79 Unspecified intellectual disabilities: Secondary | ICD-10-CM

## 2015-06-15 DIAGNOSIS — G40309 Generalized idiopathic epilepsy and epileptic syndromes, not intractable, without status epilepticus: Secondary | ICD-10-CM

## 2015-06-15 NOTE — Progress Notes (Signed)
Patient ID: Ashley Haley, female   DOB: 27-Sep-1961, 54 y.o.   MRN: [id number]                PROGRESS NOTE  DATE:  06/15/15      [name]           LEVEL OF CARE:   SNF   Routine Visit                                     CHIEF COMPLAINT:  Routine medical visit/[employer] visit.      HISTORY OF PRESENT ILLNESS:  Ashley Haley is a patient with advanced mental retardation and also seizure disorder.    She has been declining gradually.  I think she probably aspirates/chokes with meals in fact the last 2 times I have seen her because of frank aspiration episodes although she has not had any recent episodes per discussion with the staff.  She has not had any recent problems with seizures.    She is essentially comfort care in the facility.  She was treated last month for a boil on her left leg with a small area of cellulitis she was given Keflex for 7 days.  Laboratory; on 3/18 Lipid panel LDL cholesterol 69, liver function tests normal except for an albumin at 3.3 On 3/8 CBC showed a white count of 12.1 hemoglobin of 12.9. Basic metabolic panel shows sodium of 146 a BUN of 11 and creatinine of 0.36  CURRENT MEDICATIONS:  Medication list is reviewed.     Prilosec 20 q.d.       Claritin 10 q.d.      Colace 100 b.i.d.       Trileptal 1200 twice daily.    Keppra 1500 twice daily.    Depakote 625 twice daily.    Flonase 50 mcg each nostril twice daily.     Senokot b.i.d.      Klonopin 0.5 three times a day.    Senokot; 8.6bid  lipitor 20 qd  Vit D3 5000 u monthly    PHYSICAL EXAMINATION:  Weight is 228 pounds VITAL SIGNS:   RESPIRATIONS:    18  PULSE:     70   BLOOD PRESSURE:  130/76     CHEST/RESPIRATORY:  Clear air entry bilaterally.    CARDIOVASCULAR:   CARDIAC:  Heart sounds are normal.  There are no murmurs.     ASSESSMENT/PLAN:                                  Idiopathic epilepsy.  She is on oxcarbazepine, Keppra, and Depakote.  No seizures  recorded this month   Severe mental retardation.  I think she is aspirating recurrenlty.    She is largely comfort care in the facilty.

## 2015-07-08 ENCOUNTER — Other Ambulatory Visit: Payer: Self-pay | Admitting: *Deleted

## 2015-07-08 MED ORDER — CLONAZEPAM 0.5 MG PO TABS: ORAL_TABLET | ORAL | Status: DC

## 2015-07-08 NOTE — Telephone Encounter (Signed)
[facility] 

## 2015-09-07 ENCOUNTER — Non-Acute Institutional Stay (SKILLED_NURSING_FACILITY): Payer: Medicare Other | Admitting: Internal Medicine

## 2015-09-07 DIAGNOSIS — F79 Unspecified intellectual disabilities: Secondary | ICD-10-CM

## 2015-09-07 DIAGNOSIS — R569 Unspecified convulsions: Secondary | ICD-10-CM | POA: Diagnosis not present

## 2015-09-11 NOTE — Progress Notes (Addendum)
Patient ID: Ashley Haley, female   DOB: [birth year], 54 y.o.   MRN: [id number]                PROGRESS NOTE  DATE:  09/07/2015             FACILITY: [name]                   LEVEL OF CARE:   SNF   Routine Visit                  CHIEF COMPLAINT:  Routine visit/[facility] visit/review of [facility] records.      HISTORY OF PRESENT ILLNESS:  This is an unfortunate woman with advanced mental retardation and also a fairly refractory seizure disorder.    She has been gradually declining and, at various times over this year, we have felt that she is recurrently aspirating, choking with meals.     She is essentially comfort care in the facility.    Not much has changed with this patient.  There are no known seizures.  She sits out in the hall in a recliner chair.  She is nonverbal.   She is total care.    SOCIAL HISTORY:         ADVANCED DIRECTIVES:  She is a DNR and No Hospitalizations.     CURRENT MEDICATIONS:  Medication list is reviewed.       Prilosec 20 q.d.       Claritin 10 q.d.      Colace 100 b.i.d.      Trileptal 1200 b.i.d.       Keppra 1500 twice daily.      Depakote 650 b.i.d.     Flonase 50 each nostril twice daily.      REVIEW OF SYSTEMS:   Her weights have been very stable over the course of many months.  Otherwise, review of systems is not possible.      PHYSICAL EXAMINATION:   VITAL SIGNS:     TEMPERATURE:  97.4.      PULSE:  88.      RESPIRATIONS:  20.     BLOOD PRESSURE:  142/78.     WEIGHT:  225 pounds.     CHEST/RESPIRATORY:  Clear air entry bilaterally.    CARDIOVASCULAR:   CARDIAC:  Heart sounds are normal.   GASTROINTESTINAL:   ABDOMEN:  No masses.     LIVER/SPLEEN/KIDNEYS:  No liver, no spleen.   GENITOURINARY:   BLADDER:  Not enlarged.    SKIN:   INSPECTION:  There are no worrisome rashes or pressure areas.    PSYCHIATRIC:   MENTAL STATUS:   She is alert, nonverbal.    ASSESSMENT/PLAN:                     Severe intellectual  disability.  This is stable.      Seizure disorder.  No recent seizures have been described.    Recurrent aspiration.   No recent difficulties noted.

## 2015-11-18 ENCOUNTER — Non-Acute Institutional Stay (SKILLED_NURSING_FACILITY): Payer: Medicare Other | Admitting: Internal Medicine

## 2015-11-18 DIAGNOSIS — J690 Pneumonitis due to inhalation of food and vomit: Secondary | ICD-10-CM | POA: Diagnosis not present

## 2015-11-18 DIAGNOSIS — J9601 Acute respiratory failure with hypoxia: Secondary | ICD-10-CM | POA: Diagnosis not present

## 2015-11-18 DIAGNOSIS — IMO0002 Reserved for concepts with insufficient information to code with codable children: Secondary | ICD-10-CM

## 2015-11-18 DIAGNOSIS — G40309 Generalized idiopathic epilepsy and epileptic syndromes, not intractable, without status epilepticus: Secondary | ICD-10-CM | POA: Diagnosis not present

## 2015-11-22 NOTE — Progress Notes (Addendum)
Patient ID: Ashley [name], female   DOB: [birth year], 54 y.o.   MRN: [id number]                PROGRESS NOTE  DATE:  11/18/2015           [facility]  LEVEL OF CARE:   SNF   Acute Visit                   CHIEF COMPLAINT:  Hypoxemia, ?altered LOC.    HISTORY OF PRESENT ILLNESS:  The [facility].  Nurses noted that they were unable to wake her for lunch.    The nurses also reported seizure activity.  This starts with some form of cry, although I am not exactly sure if there are motor manifestations to this or not.  She required an Ativan injection on 11/07/2015, also for seizure activity.    She was also noted to be hypoxemic.  She was given Rocephin.  A chest x-ray was negative for pneumonia.  She has a BiPAP machine which she will not keep on, nor does she reliably keep oxygen on although currently her O2 sats are in the 70s on room air.     Past Medical History  Diagnosis Date  . Mental retardation   . TBI (traumatic brain injury)   . Seizures   . Difficulty walking   . Urinary tract infection   . Iron deficiency anemia   . Explosive personality disorder   . Epilepsy          CURRENT MEDICATIONS:  Medication list is reviewed.            Prilosec 20 q.d.      Claritin 10 q.d.     Hydrochlorothiazide 12.5 daily.     Colace 100 b.i.d.      Trileptal 600 mg, 2 tablets/1200 mg twice a day.      Keppra 1500 b.i.d.      Depakote Sprinkles 625 p.o. b.i.d. (level in the therapeutic range).      Senokot 8.6 b.i.d.       Klonopin 0.5 b.i.d.      Lipitor 40 q.d.      Vitamin D3, 50,000 U every month.    PHYSICAL EXAMINATION:   VITAL SIGNS:     PULSE:  78.   RESPIRATIONS:  24, and ataxic.   02 SATURATIONS:  79% on room air.   GENERAL APPEARANCE:  The patient is not in any distress.       CHEST/RESPIRATORY:  Ataxic breathing.  Shallow air entry bilaterally, but no wheezing.      CARDIOVASCULAR:   CARDIAC:  Heart  sounds are normal.  She does not appear to be in heart failure.      GASTROINTESTINAL:   ABDOMEN:  Soft.  No masses.    LIVER/SPLEEN/KIDNEYS:  No liver, no spleen.   CIRCULATION:   EDEMA/VARICOSITIES:  Extremities:  Apparently has had edema present.  This is fairly minimal.  There is really no evidence of a DVT.    ASSESSMENT/PLAN:              Seizure activity.  It is difficult to gauge this.  Apparently, her seizures start with some sort of vocalization, although I am not really sure if she has motor manifestations to this that are obvious to the staff.  She is clearly not in status epilepticus, at least in the time I spent with her this  afternoon.  She would open her eyes, pick out some of the staff.  I would not be adverse to adding more antiepileptic medications if it could be proven that she was really having refractory seizures.  It is not impossible we will have to do this in any case  Hypoxemia.  I think this is most likely aspiration.  A plain chest x-ray is negative.  Her breathing is ataxic.  We have really no way to keep oxygen on this woman, nor will she use her BiPAP.  The feeling was that at one point she had severe obstructive sleep apnea. She is not a candidate for any further imaging/CT scans or blood gases. Her ataxic breathing pattern might suggest a CNS event however this is of accademic interest only.   Comfort care in the facility.  We will contact the patient's brother to make sure we have his wishes correct, although we have been managing her this way in the facility for quite some time.    I cannot really see anything that I need to add to what the [facility] nurse practitioner has already done.   If it becomes clear that there are more seizures, she will clearly need to have augmentation or perhaps another seizure medication added.     CPT CODE: 16109

## 2016-01-08 ENCOUNTER — Other Ambulatory Visit: Payer: Self-pay

## 2016-01-08 MED ORDER — CLONAZEPAM 0.5 MG PO TABS: ORAL_TABLET | ORAL | Status: DC

## 2018-04-03 ENCOUNTER — Inpatient Hospital Stay (HOSPITAL_COMMUNITY)
Admission: EM | Admit: 2018-04-03 | Discharge: 2018-04-06 | DRG: 189 | Disposition: A | Discharge status: Skilled Nursing Facility | Payer: Medicare Other | Source: Skilled Nursing Facility | Attending: Family Medicine | Admitting: Family Medicine

## 2018-04-03 ENCOUNTER — Encounter (HOSPITAL_COMMUNITY): Payer: Self-pay | Admitting: Emergency Medicine

## 2018-04-03 ENCOUNTER — Emergency Department (HOSPITAL_COMMUNITY): Payer: Medicare Other

## 2018-04-03 DIAGNOSIS — Z7401 Bed confinement status: Secondary | ICD-10-CM

## 2018-04-03 DIAGNOSIS — J9622 Acute and chronic respiratory failure with hypercapnia: Secondary | ICD-10-CM | POA: Diagnosis present

## 2018-04-03 DIAGNOSIS — Z66 Do not resuscitate: Secondary | ICD-10-CM | POA: Diagnosis present

## 2018-04-03 DIAGNOSIS — J449 Chronic obstructive pulmonary disease, unspecified: Secondary | ICD-10-CM | POA: Diagnosis present

## 2018-04-03 DIAGNOSIS — Z7189 Other specified counseling: Secondary | ICD-10-CM

## 2018-04-03 DIAGNOSIS — Z87820 Personal history of traumatic brain injury: Secondary | ICD-10-CM | POA: Diagnosis not present

## 2018-04-03 DIAGNOSIS — F603 Borderline personality disorder: Secondary | ICD-10-CM | POA: Diagnosis present

## 2018-04-03 DIAGNOSIS — G40409 Other generalized epilepsy and epileptic syndromes, not intractable, without status epilepticus: Secondary | ICD-10-CM | POA: Diagnosis present

## 2018-04-03 DIAGNOSIS — Z79899 Other long term (current) drug therapy: Secondary | ICD-10-CM

## 2018-04-03 DIAGNOSIS — K219 Gastro-esophageal reflux disease without esophagitis: Secondary | ICD-10-CM | POA: Diagnosis present

## 2018-04-03 DIAGNOSIS — E871 Hypo-osmolality and hyponatremia: Secondary | ICD-10-CM | POA: Diagnosis present

## 2018-04-03 DIAGNOSIS — R569 Unspecified convulsions: Secondary | ICD-10-CM | POA: Diagnosis not present

## 2018-04-03 DIAGNOSIS — E876 Hypokalemia: Secondary | ICD-10-CM | POA: Diagnosis not present

## 2018-04-03 DIAGNOSIS — E222 Syndrome of inappropriate secretion of antidiuretic hormone: Secondary | ICD-10-CM | POA: Diagnosis present

## 2018-04-03 DIAGNOSIS — I10 Essential (primary) hypertension: Secondary | ICD-10-CM | POA: Diagnosis present

## 2018-04-03 DIAGNOSIS — X58XXXS Exposure to other specified factors, sequela: Secondary | ICD-10-CM | POA: Diagnosis present

## 2018-04-03 DIAGNOSIS — R0689 Other abnormalities of breathing: Secondary | ICD-10-CM | POA: Diagnosis not present

## 2018-04-03 DIAGNOSIS — R5383 Other fatigue: Secondary | ICD-10-CM | POA: Diagnosis present

## 2018-04-03 DIAGNOSIS — Z7982 Long term (current) use of aspirin: Secondary | ICD-10-CM | POA: Diagnosis not present

## 2018-04-03 DIAGNOSIS — Z6831 Body mass index (BMI) 31.0-31.9, adult: Secondary | ICD-10-CM

## 2018-04-03 DIAGNOSIS — R532 Functional quadriplegia: Secondary | ICD-10-CM | POA: Diagnosis present

## 2018-04-03 DIAGNOSIS — S069X0S Unspecified intracranial injury without loss of consciousness, sequela: Secondary | ICD-10-CM

## 2018-04-03 DIAGNOSIS — J9621 Acute and chronic respiratory failure with hypoxia: Secondary | ICD-10-CM | POA: Diagnosis present

## 2018-04-03 DIAGNOSIS — E785 Hyperlipidemia, unspecified: Secondary | ICD-10-CM | POA: Diagnosis present

## 2018-04-03 DIAGNOSIS — Z515 Encounter for palliative care: Secondary | ICD-10-CM | POA: Diagnosis present

## 2018-04-03 DIAGNOSIS — I110 Hypertensive heart disease with heart failure: Secondary | ICD-10-CM | POA: Diagnosis present

## 2018-04-03 DIAGNOSIS — E261 Secondary hyperaldosteronism: Secondary | ICD-10-CM | POA: Diagnosis present

## 2018-04-03 DIAGNOSIS — F73 Profound intellectual disabilities: Secondary | ICD-10-CM | POA: Diagnosis present

## 2018-04-03 DIAGNOSIS — R0902 Hypoxemia: Secondary | ICD-10-CM | POA: Diagnosis present

## 2018-04-03 DIAGNOSIS — R21 Rash and other nonspecific skin eruption: Secondary | ICD-10-CM | POA: Diagnosis not present

## 2018-04-03 DIAGNOSIS — E662 Morbid (severe) obesity with alveolar hypoventilation: Secondary | ICD-10-CM | POA: Diagnosis present

## 2018-04-03 DIAGNOSIS — I509 Heart failure, unspecified: Secondary | ICD-10-CM | POA: Diagnosis present

## 2018-04-03 DIAGNOSIS — Z9119 Patient's noncompliance with other medical treatment and regimen: Secondary | ICD-10-CM

## 2018-04-03 HISTORY — DX: Other generalized epilepsy and epileptic syndromes, intractable, with status epilepticus: G40.411

## 2018-04-03 HISTORY — DX: Essential (primary) hypertension: I10

## 2018-04-03 HISTORY — DX: Hypo-osmolality and hyponatremia: E87.1

## 2018-04-03 HISTORY — DX: Generalized idiopathic epilepsy and epileptic syndromes, not intractable, without status epilepticus: G40.309

## 2018-04-03 HISTORY — DX: Contracture, right hand: M24.541

## 2018-04-03 HISTORY — DX: Profound intellectual disabilities: F73

## 2018-04-03 HISTORY — DX: Gastro-esophageal reflux disease without esophagitis: K21.9

## 2018-04-03 HISTORY — DX: Heart failure, unspecified: I50.9

## 2018-04-03 HISTORY — DX: Functional quadriplegia: R53.2

## 2018-04-03 HISTORY — DX: Hyperlipidemia, unspecified: E78.5

## 2018-04-03 HISTORY — DX: Muscle weakness (generalized): M62.81

## 2018-04-03 HISTORY — DX: Dysphagia, unspecified: R13.10

## 2018-04-03 HISTORY — DX: Unspecified asthma, uncomplicated: J45.909

## 2018-04-03 HISTORY — DX: Chronic obstructive pulmonary disease, unspecified: J44.9

## 2018-04-03 HISTORY — DX: Other epilepsy, not intractable, without status epilepticus: G40.802

## 2018-04-03 HISTORY — DX: Abnormal posture: R29.3

## 2018-04-03 LAB — COMPREHENSIVE METABOLIC PANEL
ALT: 10 U/L — ABNORMAL LOW (ref 14–54)
AST: 11 U/L — ABNORMAL LOW (ref 15–41)
Albumin: 3.3 g/dL — ABNORMAL LOW (ref 3.5–5.0)
Alkaline Phosphatase: 68 U/L (ref 38–126)
Anion gap: 9 (ref 5–15)
BUN: 10 mg/dL (ref 6–20)
CO2: 39 mmol/L — ABNORMAL HIGH (ref 22–32)
Calcium: 9.5 mg/dL (ref 8.9–10.3)
Chloride: 80 mmol/L — ABNORMAL LOW (ref 101–111)
Creatinine, Ser: <0.3 mg/dL — ABNORMAL LOW (ref 0.44–1.00)
Glucose, Bld: 98 mg/dL (ref 65–99)
Potassium: 4.7 mmol/L (ref 3.5–5.1)
Sodium: 128 mmol/L — ABNORMAL LOW (ref 135–145)
Total Bilirubin: 0.3 mg/dL (ref 0.3–1.2)
Total Protein: 7.1 g/dL (ref 6.5–8.1)

## 2018-04-03 LAB — BLOOD GAS, ARTERIAL
Acid-Base Excess: 15.4 mmol/L — ABNORMAL HIGH (ref 0.0–2.0)
Acid-Base Excess: 13.7 mmol/L — ABNORMAL HIGH (ref 0.0–2.0)
Bicarbonate: 35.9 mmol/L — ABNORMAL HIGH (ref 20.0–28.0)
Bicarbonate: 34.8 mmol/L — ABNORMAL HIGH (ref 20.0–28.0)
DRAWN BY: 221791
Drawn by: 221791
Expiratory PAP: 6
FIO2: 40
Inspiratory PAP: 16
Mode: POSITIVE
O2 Content: 2 L/min
O2 SAT: 93 %
O2 Saturation: 94.2 %
PCO2 ART: 92.9 mmHg — AB (ref 32.0–48.0)
PCO2 ART: 77 mmHg — AB (ref 32.0–48.0)
PH ART: 7.335 — AB (ref 7.350–7.450)
PO2 ART: 78.8 mmHg — AB (ref 83.0–108.0)
PO2 ART: 69.5 mmHg — AB (ref 83.0–108.0)
RATE: 20 resp/min
pH, Arterial: 7.283 — ABNORMAL LOW (ref 7.350–7.450)

## 2018-04-03 LAB — CBC WITH DIFFERENTIAL/PLATELET
BASOS ABS: 0 10*3/uL (ref 0.0–0.1)
BASOS PCT: 0 %
Eosinophils Absolute: 0.1 10*3/uL (ref 0.0–0.7)
Eosinophils Relative: 1 %
HEMATOCRIT: 41.1 % (ref 36.0–46.0)
HEMOGLOBIN: 12.9 g/dL (ref 12.0–15.0)
LYMPHS PCT: 21 %
Lymphs Abs: 1.8 10*3/uL (ref 0.7–4.0)
MCH: 31.9 pg (ref 26.0–34.0)
MCHC: 31.4 g/dL (ref 30.0–36.0)
MCV: 101.7 fL — AB (ref 78.0–100.0)
MONO ABS: 0.8 10*3/uL (ref 0.1–1.0)
Monocytes Relative: 9 %
NEUTROS ABS: 5.8 10*3/uL (ref 1.7–7.7)
Neutrophils Relative %: 69 %
Platelets: 288 10*3/uL (ref 150–400)
RBC: 4.04 MIL/uL (ref 3.87–5.11)
RDW: 13.3 % (ref 11.5–15.5)
WBC: 8.4 10*3/uL (ref 4.0–10.5)

## 2018-04-03 LAB — URINALYSIS, ROUTINE W REFLEX MICROSCOPIC
Bilirubin Urine: NEGATIVE
Glucose, UA: NEGATIVE mg/dL
Hgb urine dipstick: NEGATIVE
Ketones, ur: 5 mg/dL — AB
Leukocytes, UA: NEGATIVE
Nitrite: NEGATIVE
Protein, ur: NEGATIVE mg/dL
Specific Gravity, Urine: 1.026 (ref 1.005–1.030)
pH: 5 (ref 5.0–8.0)

## 2018-04-03 LAB — BRAIN NATRIURETIC PEPTIDE: B Natriuretic Peptide: 12 pg/mL (ref 0.0–100.0)

## 2018-04-03 LAB — LACTIC ACID, PLASMA: Lactic Acid, Venous: 1.1 mmol/L (ref 0.5–1.9)

## 2018-04-03 MED ORDER — ONDANSETRON HCL 4 MG/2ML IJ SOLN: 4.0000 mg | Freq: Four times a day (QID) | INTRAMUSCULAR | Status: DC | PRN

## 2018-04-03 MED ORDER — LEVETIRACETAM 500 MG PO TABS
500.0000 mg | ORAL_TABLET | Freq: Two times a day (BID) | ORAL | Status: DC
  Filled 2018-04-03: qty 1

## 2018-04-03 MED ORDER — IPRATROPIUM-ALBUTEROL 0.5-2.5 (3) MG/3ML IN SOLN
3.0000 mL | Freq: Four times a day (QID) | RESPIRATORY_TRACT | Status: DC
  Administered 2018-04-03 – 2018-04-04 (×5): 3 mL via RESPIRATORY_TRACT
  Filled 2018-04-03 (×4): qty 3

## 2018-04-03 MED ORDER — IPRATROPIUM-ALBUTEROL 0.5-2.5 (3) MG/3ML IN SOLN
RESPIRATORY_TRACT | Status: AC
  Administered 2018-04-03: 3 mL
  Filled 2018-04-03: qty 3

## 2018-04-03 MED ORDER — IPRATROPIUM-ALBUTEROL 0.5-2.5 (3) MG/3ML IN SOLN
3.0000 mL | Freq: Once | RESPIRATORY_TRACT | Status: AC
  Administered 2018-04-03: 3 mL via RESPIRATORY_TRACT
  Filled 2018-04-03: qty 3

## 2018-04-03 MED ORDER — OCUSOFT LID SCRUB EX PADS: 1.0000 | MEDICATED_PAD | Freq: Every day | CUTANEOUS | Status: DC

## 2018-04-03 MED ORDER — ARFORMOTEROL TARTRATE 15 MCG/2ML IN NEBU
15.0000 ug | INHALATION_SOLUTION | Freq: Two times a day (BID) | RESPIRATORY_TRACT | Status: DC
  Administered 2018-04-03 – 2018-04-06 (×6): 15 ug via RESPIRATORY_TRACT
  Filled 2018-04-03 (×6): qty 2

## 2018-04-03 MED ORDER — SODIUM CHLORIDE 1 G PO TABS
1.0000 g | ORAL_TABLET | Freq: Three times a day (TID) | ORAL | Status: DC
  Administered 2018-04-05 – 2018-04-06 (×3): 1 g via ORAL
  Filled 2018-04-03 (×15): qty 1

## 2018-04-03 MED ORDER — ONDANSETRON HCL 4 MG PO TABS: 4.0000 mg | ORAL_TABLET | Freq: Four times a day (QID) | ORAL | Status: DC | PRN

## 2018-04-03 MED ORDER — METHYLPREDNISOLONE SODIUM SUCC 125 MG IJ SOLR
125.0000 mg | Freq: Once | INTRAMUSCULAR | Status: AC
  Administered 2018-04-03: 125 mg via INTRAVENOUS
  Filled 2018-04-03: qty 2

## 2018-04-03 MED ORDER — ATORVASTATIN CALCIUM 40 MG PO TABS
40.0000 mg | ORAL_TABLET | Freq: Every evening | ORAL | Status: DC
  Administered 2018-04-05: 40 mg via ORAL
  Filled 2018-04-03: qty 1

## 2018-04-03 MED ORDER — VANCOMYCIN HCL IN DEXTROSE 1-5 GM/200ML-% IV SOLN
1000.0000 mg | Freq: Once | INTRAVENOUS | Status: AC
  Administered 2018-04-03: 1000 mg via INTRAVENOUS
  Filled 2018-04-03: qty 200

## 2018-04-03 MED ORDER — LEVETIRACETAM 500 MG PO TABS
1000.0000 mg | ORAL_TABLET | Freq: Two times a day (BID) | ORAL | Status: DC
  Filled 2018-04-03: qty 2

## 2018-04-03 MED ORDER — ALBUTEROL SULFATE (2.5 MG/3ML) 0.083% IN NEBU: 2.5000 mg | INHALATION_SOLUTION | RESPIRATORY_TRACT | Status: DC | PRN

## 2018-04-03 MED ORDER — ACETAMINOPHEN 650 MG RE SUPP: 650.0000 mg | Freq: Four times a day (QID) | RECTAL | Status: DC | PRN

## 2018-04-03 MED ORDER — ASPIRIN EC 81 MG PO TBEC
81.0000 mg | DELAYED_RELEASE_TABLET | Freq: Every day | ORAL | Status: DC
  Administered 2018-04-05 – 2018-04-06 (×2): 81 mg via ORAL
  Filled 2018-04-03 (×3): qty 1

## 2018-04-03 MED ORDER — ACETAMINOPHEN 325 MG PO TABS: 650.0000 mg | ORAL_TABLET | Freq: Four times a day (QID) | ORAL | Status: DC | PRN

## 2018-04-03 MED ORDER — OXCARBAZEPINE 300 MG PO TABS
1200.0000 mg | ORAL_TABLET | Freq: Two times a day (BID) | ORAL | Status: DC
  Administered 2018-04-05 – 2018-04-06 (×3): 1200 mg via ORAL
  Filled 2018-04-03 (×13): qty 4

## 2018-04-03 MED ORDER — ALBUTEROL SULFATE (2.5 MG/3ML) 0.083% IN NEBU
INHALATION_SOLUTION | RESPIRATORY_TRACT | Status: AC
  Administered 2018-04-03: 2.5 mg
  Filled 2018-04-03: qty 3

## 2018-04-03 MED ORDER — DIVALPROEX SODIUM 125 MG PO CSDR
750.0000 mg | DELAYED_RELEASE_CAPSULE | Freq: Two times a day (BID) | ORAL | Status: DC
  Filled 2018-04-03 (×7): qty 6

## 2018-04-03 MED ORDER — GUAIFENESIN ER 600 MG PO TB12
600.0000 mg | ORAL_TABLET | Freq: Two times a day (BID) | ORAL | Status: DC
  Administered 2018-04-05 – 2018-04-06 (×3): 600 mg via ORAL
  Filled 2018-04-03 (×5): qty 1

## 2018-04-03 MED ORDER — PANTOPRAZOLE SODIUM 40 MG PO TBEC
40.0000 mg | DELAYED_RELEASE_TABLET | Freq: Every day | ORAL | Status: DC
  Administered 2018-04-05 – 2018-04-06 (×2): 40 mg via ORAL
  Filled 2018-04-03 (×3): qty 1

## 2018-04-03 MED ORDER — PIPERACILLIN-TAZOBACTAM 3.375 G IVPB 30 MIN
3.3750 g | Freq: Once | INTRAVENOUS | Status: AC
  Administered 2018-04-03: 3.375 g via INTRAVENOUS
  Filled 2018-04-03: qty 50

## 2018-04-03 MED ORDER — ENOXAPARIN SODIUM 40 MG/0.4ML ~~LOC~~ SOLN
40.0000 mg | SUBCUTANEOUS | Status: DC
  Administered 2018-04-04 – 2018-04-05 (×3): 40 mg via SUBCUTANEOUS
  Filled 2018-04-03 (×3): qty 0.4

## 2018-04-03 NOTE — ED Triage Notes (Signed)
[facility].  CXR done at facility 4/13 showed LLL pneumonia.  Pt normally is altered, but O2 sat was low today.

## 2018-04-03 NOTE — ED Provider Notes (Signed)
[facility] EMERGENCY DEPARTMENT Provider Note   CSN: [id number] Arrival date & time: 04/03/18  1436     History   Chief Complaint Chief Complaint  Patient presents with  . Abnormal Lab    HPI Ashley Haley is a 57 y.o. female.  The patient has chronic problems of COPD heart failure mental retardation that is severe traumatic brain injury seizures.  Her oxygen levels been low and she had an x-ray at the nursing home that showed possible pneumonia.  The patient is a DO NOT RESUSCITATE  The history is provided by the nursing home. No language interpreter was used.  Shortness of Breath  This is a chronic problem. The problem occurs continuously.The current episode started more than 1 week ago. The problem has been gradually worsening. Pertinent negatives include no fever. Precipitated by: COPD. Risk factors: Bedridden.    Past Medical History:  Diagnosis Date  . Abnormal posture   . Asthma   . CHF (congestive heart failure) (HCC)   . Contracture of joint of right hand   . COPD (chronic obstructive pulmonary disease) (HCC)   . Cursive epilepsy (HCC)   . Difficulty walking   . Dysphagia   . Epilepsy (HCC)   . Explosive personality disorder (HCC)   . Explosive personality disorder (HCC)   . Functional quadriplegia (HCC)   . GERD (gastroesophageal reflux disease)   . Hyperlipemia   . Hypertension   . Hyposmolality syndrome   . Iron deficiency anemia   . Mental retardation   . Muscle weakness (generalized)   . Oth generalized epilepsy, intractable, w status epilepticus (HCC)   . Profound intellectual disabilities   . Seizures (HCC)   . TBI (traumatic brain injury) (HCC)   . Urinary tract infection     Patient Active Problem List   Diagnosis Date Noted  . Chronic hyponatremia 04/03/2018  . 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 Recurrent aspiration pneumonia (HCC) 12/09/2014  . DNR (do not resuscitate) 12/09/2014  . Other and unspecified hyperlipidemia  06/25/2014  . Allergic rhinitis 06/25/2014  . Mental retardation 02/28/2014  . Seizures (HCC) 02/28/2014  . Edema 02/28/2014    Past Surgical History:  Procedure Laterality Date  . unable to obtain       OB History   None      Home Medications    Prior to Admission medications   Medication Sig Start Date End Date Taking? Authorizing Provider  arformoterol (BROVANA) 15 MCG/2ML NEBU Take 15 mcg by nebulization 2 (two) times daily.   Yes [provider]  aspirin EC 81 MG tablet Take 81 mg by mouth daily.   Yes [provider]  atorvastatin (LIPITOR) 40 MG tablet Take 40 mg by mouth every evening.    Yes [provider]  Calcium 500-100 MG-UNIT CHEW Chew 500 mg by mouth 3 (three) times daily.   Yes [provider]  clonazePAM (KLONOPIN) 0.5 MG tablet Take one tablet by mouth twice daily for anxiety Patient taking differently: Take 0.25-0.5 mg by mouth See admin instructions. Take 0.25mg  by mouth twice daily at 8a and 2000, then take 0.5mg   at 9a and 2100p 01/08/16  Yes Kimber Relic, MD  divalproex (DEPAKOTE SPRINKLE) 125 MG capsule Take 750 mg by mouth 2 (two) times daily. 6 capsules  Twice daily   Yes [provider]  Eyelid Cleansers (OCUSOFT LID SCRUB) PADS Apply 1 each topically daily. Wait 3-5 mins.between 2 eye meds.   Yes [provider]  ketoconazole (NIZORAL) 2 % shampoo Apply 1 application topically 2 (two) times a week. Every Monday and Thursdays   Yes [provider]  levETIRAcetam (KEPPRA) 1000 MG tablet Take 1,000 mg by mouth 2 (two) times daily. Take along with 500mg  to equal 1500mg    Yes [provider]  levETIRAcetam (KEPPRA) 500 MG tablet Take 500 mg by mouth 2 (two) times daily. Take with keppra 1000mg  to equal 1500mg    Yes [provider]  loratadine (CLARITIN) 10 MG tablet Take 10 mg by mouth daily.   Yes [provider]  LORazepam (ATIVAN) 2 MG/ML injection Administer 0.73ml  intramuscularly for 1 dose as needed for seizure; may repeat 0.75 for 1 dose in 10-15 minutes as needed and call MD 01/26/14  Yes Reed, Tiffany L, DO  omeprazole (PRILOSEC) 20 MG capsule Take 20 mg by mouth daily.   Yes [provider]  oxcarbazepine (TRILEPTAL) 600 MG tablet Take 1,200 mg by mouth 2 (two) times daily.   Yes [provider]  senna (SENOKOT) 8.6 MG TABS tablet Take 1 tablet by mouth 2 (two) times daily.   Yes [provider]  sodium chloride 1 g tablet Take 1 g by mouth 3 (three) times daily.   Yes [provider]  Vitamin D, Ergocalciferol, (DRISDOL) 50000 units CAPS capsule Take 50,000 Units by mouth every 30 (thirty) days. Take in the morning on the 4th   Yes [provider]    Family History History reviewed. No pertinent family history.  Social History Social History   Tobacco Use  . Smoking status: Never Smoker  Substance Use Topics  . Alcohol use: No  . Drug use: Not on file     Allergies   Patient has no known allergies.   Review of Systems Review of Systems  Unable to perform ROS: Mental status change  Constitutional: Negative for fever.  Respiratory: Positive for shortness of breath.      Physical Exam Updated Vital Signs BP (!) 113/55   Pulse 70   Temp 99 F (37.2 C)   Resp (!) 21   Ht 5\' 8"  (1.727 m)   Wt 113.4 kg (250 lb)   SpO2 94%   BMI 38.01 kg/m   Physical Exam  Constitutional: She appears well-developed.  HENT:  Head: Normocephalic.  Eyes: Conjunctivae and EOM are normal. No scleral icterus.  Neck: Neck supple. No thyromegaly present.  Cardiovascular: Normal rate and regular rhythm. Exam reveals no gallop and no friction rub.  No murmur heard. Pulmonary/Chest: No stridor. She has no wheezes. She has no rales. She exhibits no tenderness.  Abdominal: She exhibits no distension. There is no tenderness. There is no rebound.  Musculoskeletal: Normal range of motion. She exhibits no edema.    Lymphadenopathy:    She has no cervical adenopathy.  Neurological: She exhibits normal muscle tone. Coordination normal.  Patient very lethargic but will open her eyes to verbal stimuli does have a gag reflex  Skin: No rash noted. No erythema. Pallor: .edcr.     ED Treatments / Results  Labs (all labs ordered are listed, but only abnormal results are displayed) Labs Reviewed  CBC WITH DIFFERENTIAL/PLATELET - Abnormal; Notable for the following components:      Result Value   MCV 101.7 (*)    All other components within normal limits  COMPREHENSIVE METABOLIC PANEL - Abnormal; Notable for the following components:   Sodium 128 (*)    Chloride 80 (*)    CO2  39 (*)    Creatinine, Ser <0.30 (*)    Albumin 3.3 (*)    AST 11 (*)    ALT 10 (*)    All other components within normal limits  URINALYSIS, ROUTINE W REFLEX MICROSCOPIC - Abnormal; Notable for the following components:   Ketones, ur 5 (*)    All other components within normal limits  BLOOD GAS, ARTERIAL - Abnormal; Notable for the following components:   pH, Arterial 7.283 (*)    pCO2 arterial 92.9 (*)    pO2, Arterial 78.8 (*)    Bicarbonate 35.9 (*)    Acid-Base Excess 15.4 (*)    All other components within normal limits  LACTIC ACID, PLASMA  BRAIN NATRIURETIC PEPTIDE    EKG None  Radiology Dg Chest Portable 1 View  Result Date: 04/03/2018 CLINICAL DATA:  Cough, unresponsive. EXAM: PORTABLE CHEST 1 VIEW COMPARISON:  Chest x-ray dated 03/03/2015. FINDINGS: Study is hypoinspiratory. Probable mild atelectasis and/or small pleural effusion at the LEFT lung base. Given the low lung volumes, lungs appear otherwise clear. No pneumothorax seen. Heart size and mediastinal contours are within normal limits. Osseous structures about the chest are unremarkable. IMPRESSION: Low lung volumes. Probable mild atelectasis and/or small pleural effusion at the LEFT lung base. Electronically Signed   By: Bary RichardStan  Maynard M.D.   On:  04/03/2018 15:59    Procedures Procedures (including critical care time)  Medications Ordered in ED Medications  methylPREDNISolone sodium succinate (SOLU-MEDROL) 125 mg/2 mL injection 125 mg (125 mg Intravenous Given 04/03/18 1625)  ipratropium-albuterol (DUONEB) 0.5-2.5 (3) MG/3ML nebulizer solution 3 mL (3 mLs Nebulization Given 04/03/18 1627)  vancomycin (VANCOCIN) IVPB 1000 mg/200 mL premix (0 mg Intravenous Stopped 04/03/18 1735)  piperacillin-tazobactam (ZOSYN) IVPB 3.375 g (3.375 g Intravenous New Bag/Given 04/03/18 1735)     Initial Impression / Assessment and Plan / ED Course  I have reviewed the triage vital signs and the nursing notes.  Pertinent labs & imaging results that were available during my care of the patient were reviewed by me and considered in my medical decision making (see chart for details).     CRITICAL CARE Performed by: Bethann BerkshireJoseph Nyla Creason Total critical care time: 40 minutes Critical care time was exclusive of separately billable procedures and treating other patients. Critical care was necessary to treat or prevent imminent or life-threatening deterioration. Critical care was time spent personally by me on the following activities: development of treatment plan with patient and/or surrogate as well as nursing, discussions with consultants, evaluation of patient's response to treatment, examination of patient, obtaining history from patient or surrogate, ordering and performing treatments and interventions, ordering and review of laboratory studies, ordering and review of radiographic studies, pulse oximetry and re-evaluation of patient's condition.  Patient with hypoxia and CO2 retention.  She has bad COPD and history of heart failure.  This seems to be more chronic.  Patient may have a pneumonia.  She will be admitted for hypoxemia and possible pneumonia Final Clinical Impressions(s) / ED Diagnoses   Final diagnoses:  Hypoxemia    ED Discharge Orders     None       Bethann BerkshireZammit, Giacomo Valone, MD 04/03/18 1810

## 2018-04-03 NOTE — H&P (Signed)
History and Physical  Wavie Hashimi ZOX:[id number] DOB: 1961/01/11 DOA: 04/03/2018  Referring physician: Aileen Pilot, ED physician PCP: Bernerd Limbo, MD  Outpatient Specialists:   Patient Coming From: [facility]  Chief Complaint: hypoxia  HPI: Talayla Doyel is a 57 y.o. female with a history of COPD, CHF, traumatic brain injury with cognitive deficits, secondary hyper aldosteronism, GERD, hypertension, functional quadriplegia, seizure disorder.  Patient is unable to provide history.  She is a resident of [facility].  At the facility, she had some low oxygen levels and had an x-ray that showed possible pneumonia.  EMS was called to help assist put in a peripheral IV so she could be given antibiotics.  When they arrived, her O2 saturation was 70%.  She was brought to the emergency department for evaluation.  At that time, she showed no signs of distress.  Emergency Department Course: In the emergency department, she was initially on nasal cannula with an oxygen saturation of 93%.  Blood gas was drawn with a pH of 7.28 and a PCO2 of greater than 90.  Her PO2 was 78  Review of Systems:   Patient unable to provide.  Since with nursing facility with the patient resides, no fevers, chills, nausea, vomiting, cough.  Past Medical History:  Diagnosis Date  . Abnormal posture   . Asthma   . CHF (congestive heart failure) (HCC)   . Contracture of joint of right hand   . COPD (chronic obstructive pulmonary disease) (HCC)   . Cursive epilepsy (HCC)   . Difficulty walking   . Dysphagia   . Epilepsy (HCC)   . Explosive personality disorder (HCC)   . Explosive personality disorder (HCC)   . Functional quadriplegia (HCC)   . GERD (gastroesophageal reflux disease)   . Hyperlipemia   . Hypertension   . Hyposmolality syndrome   . Iron deficiency anemia   . Mental retardation   . Muscle weakness (generalized)   . Oth generalized epilepsy, intractable, w status epilepticus (HCC)   .  Profound intellectual disabilities   . Seizures (HCC)   . TBI (traumatic brain injury) (HCC)   . Urinary tract infection    Past Surgical History:  Procedure Laterality Date  . unable to obtain     Social History:  reports that she has never smoked. She does not have any smokeless tobacco history on file. She reports that she does not drink alcohol. Her drug history is not on file. Patient lives at [facility]  No Known Allergies  Patient unable to provide history.  No history available in chart  Prior to Admission medications   Medication Sig Start Date End Date Taking? Authorizing Provider  arformoterol (BROVANA) 15 MCG/2ML NEBU Take 15 mcg by nebulization 2 (two) times daily.   Yes [provider]  aspirin EC 81 MG tablet Take 81 mg by mouth daily.   Yes [provider]  atorvastatin (LIPITOR) 40 MG tablet Take 40 mg by mouth every evening.    Yes [provider]  Calcium 500-100 MG-UNIT CHEW Chew 500 mg by mouth 3 (three) times daily.   Yes [provider]  clonazePAM (KLONOPIN) 0.5 MG tablet Take one tablet by mouth twice daily for anxiety Patient taking differently: Take 0.25-0.5 mg by mouth See admin instructions. Take 0.25mg  by mouth twice daily at 8a and 2000, then take 0.5mg   at 9a and 2100p 01/08/16  Yes Kimber Relic, MD  divalproex (DEPAKOTE SPRINKLE) 125 MG capsule Take 750 mg by mouth  2 (two) times daily. 6 capsules  Twice daily   Yes [provider]  Eyelid Cleansers (OCUSOFT LID SCRUB) PADS Apply 1 each topically daily. Wait 3-5 mins.between 2 eye meds.   Yes [provider]  ketoconazole (NIZORAL) 2 % shampoo Apply 1 application topically 2 (two) times a week. Every Monday and Thursdays   Yes [provider]  levETIRAcetam (KEPPRA) 1000 MG tablet Take 1,000 mg by mouth 2 (two) times daily. Take along with 500mg  to equal 1500mg    Yes [provider]  levETIRAcetam (KEPPRA) 500 MG tablet Take 500 mg  by mouth 2 (two) times daily. Take with keppra 1000mg  to equal 1500mg    Yes [provider]  loratadine (CLARITIN) 10 MG tablet Take 10 mg by mouth daily.   Yes [provider]  LORazepam (ATIVAN) 2 MG/ML injection Administer 0.11ml intramuscularly for 1 dose as needed for seizure; may repeat 0.75 for 1 dose in 10-15 minutes as needed and call MD 01/26/14  Yes Reed, Tiffany L, DO  omeprazole (PRILOSEC) 20 MG capsule Take 20 mg by mouth daily.   Yes [provider]  oxcarbazepine (TRILEPTAL) 600 MG tablet Take 1,200 mg by mouth 2 (two) times daily.   Yes [provider]  senna (SENOKOT) 8.6 MG TABS tablet Take 1 tablet by mouth 2 (two) times daily.   Yes [provider]  sodium chloride 1 g tablet Take 1 g by mouth 3 (three) times daily.   Yes [provider]  Vitamin D, Ergocalciferol, (DRISDOL) 50000 units CAPS capsule Take 50,000 Units by mouth every 30 (thirty) days. Take in the morning on the 4th   Yes [provider]    Physical Exam: BP (!) 114/52   Pulse 71   Temp 99.3 F (37.4 C)   Resp 20   Ht 5\' 8"  (1.727 m)   Wt 113.4 kg (250 lb)   SpO2 94%   BMI 38.01 kg/m   . General: Older appearing Caucasian female.  Patient lethargic, but opens eyes to verbal stimuli, gag reflex, painful stimuli.  No acute cardiopulmonary distress.  Marland Kitchen HEENT: Normocephalic atraumatic.  Right and left ears normal in appearance.  Pupils equal, round, reactive to light. Extraocular muscles are intact. Sclerae anicteric and noninjected.  Moist mucosal membranes. No mucosal lesions.  . Neck: Neck supple without lymphadenopathy. No carotid bruits. No masses palpated.  . Cardiovascular: Regular rate with normal S1-S2 sounds. No murmurs, rubs, gallops auscultated. No JVD.  Marland Kitchen Respiratory: Good respiratory effort with no wheezes, rales, rhonchi. Lungs clear to auscultation bilaterally.  No accessory muscle use. . Abdomen: Soft, nontender, nondistended.  Active bowel sounds. No masses or hepatosplenomegaly  . Skin: No rashes, lesions, or ulcerations.  Dry, warm to touch. 2+ dorsalis pedis and radial pulses. . Musculoskeletal: No calf or leg pain. All major joints not erythematous nontender.  No upper or lower joint deformation.  Good ROM.  No contractures  . Psychiatric: Unable to determine. . Neurologic: Patient unable to comply with neurological exam.           Labs on Admission: I have personally reviewed following labs and imaging studies  CBC: Recent Labs  Lab 04/03/18 1618  WBC 8.4  NEUTROABS 5.8  HGB 12.9  HCT 41.1  MCV 101.7*  PLT 288   Basic Metabolic Panel: Recent Labs  Lab 04/03/18 1618  NA 128*  K 4.7  CL 80*  CO2 39*  GLUCOSE 98  BUN 10  CREATININE <0.30*  CALCIUM 9.5   GFR: CrCl cannot be calculated (This lab value cannot be used to calculate CrCl because it is not a number: <0.30). Liver Function Tests: Recent Labs  Lab 04/03/18 1618  AST 11*  ALT 10*  ALKPHOS 68  BILITOT 0.3  PROT 7.1  ALBUMIN 3.3*   No results for input(s): LIPASE, AMYLASE in the last 168 hours. No results for input(s): AMMONIA in the last 168 hours. Coagulation Profile: No results for input(s): INR, PROTIME in the last 168 hours. Cardiac Enzymes: No results for input(s): CKTOTAL, CKMB, CKMBINDEX, TROPONINI in the last 168 hours. BNP (last 3 results) No results for input(s): PROBNP in the last 8760 hours. HbA1C: No results for input(s): HGBA1C in the last 72 hours. CBG: No results for input(s): GLUCAP in the last 168 hours. Lipid Profile: No results for input(s): CHOL, HDL, LDLCALC, TRIG, CHOLHDL, LDLDIRECT in the last 72 hours. Thyroid Function Tests: No results for input(s): TSH, T4TOTAL, FREET4, T3FREE, THYROIDAB in the last 72 hours. Anemia Panel: No results for input(s): VITAMINB12, FOLATE, FERRITIN, TIBC, IRON, RETICCTPCT in the last 72 hours. Urine analysis:    Component Value Date/Time   COLORURINE YELLOW  04/03/2018 1526   APPEARANCEUR CLEAR 04/03/2018 1526   LABSPEC 1.026 04/03/2018 1526   PHURINE 5.0 04/03/2018 1526   GLUCOSEU NEGATIVE 04/03/2018 1526   HGBUR NEGATIVE 04/03/2018 1526   BILIRUBINUR NEGATIVE 04/03/2018 1526   KETONESUR 5 (A) 04/03/2018 1526   PROTEINUR NEGATIVE 04/03/2018 1526   UROBILINOGEN 0.2 03/03/2015 0820   NITRITE NEGATIVE 04/03/2018 1526   LEUKOCYTESUR NEGATIVE 04/03/2018 1526   Sepsis Labs: @LABRCNTIP (procalcitonin:4,lacticidven:4) )No results found for this or any previous visit (from the past 240 hour(s)).   Radiological Exams on Admission: Dg Chest Portable 1 View  Result Date: 04/03/2018 CLINICAL DATA:  Cough, unresponsive. EXAM: PORTABLE CHEST 1 VIEW COMPARISON:  Chest x-ray dated 03/03/2015. FINDINGS: Study is hypoinspiratory. Probable mild atelectasis and/or small pleural effusion at the LEFT lung base. Given the low lung volumes, lungs appear otherwise clear. No pneumothorax seen. Heart size and mediastinal contours are within normal limits. Osseous structures about the chest are unremarkable. IMPRESSION: Low lung volumes. Probable mild atelectasis and/or small pleural effusion at the LEFT lung base. Electronically Signed   By: Bary Richard M.D.   On: 04/03/2018 15:59    EKG: Independently reviewed.  Sinus rhythm.  Right axis deviation  Assessment/Plan: Principal Problem:   Hypoxia Active Problems:   Seizures (HCC)   Chronic hyponatremia   Secondary hyperaldosteronism (HCC)   Essential hypertension   Heart failure due to high blood pressure (HCC)   COPD with chronic bronchitis (HCC)   GERD (gastroesophageal reflux disease)   Functional quadriplegia (HCC)   Hypercapnia   Lethargy    This patient was discussed with the ED physician, including pertinent vitals, physical exam findings, labs, and imaging.  We also discussed care given by the ED provider.  1. Hypoxia 2. Hypercapnia a. BiPAP was initiated by EDP b. 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 Corrected pH is 7.43 d. PCO2 appears to be improving on BiPAP, however her PO2 appears to be worse.  I did talk with the respiratory therapist who felt this was a good arterial sample.  Will increase rate and repeat blood gas after changes have been made. e. There are notes in the medical record from Baltazar Najjar, MD, who used to  care for this patient.  According to his notes, the patient was comfort measures only and supposed to be on BiPAP and oxygen during the day.  His notes indicate that the patient was noncompliant with these. f. I discussed the patient with a nursing facility -they do not have any orders for comfort measures only on this patient.  She does not use BiPAP or CPAP on a chronic basis. 3. Chronic hyponatremia a. In my discussions with the nursing facility, the patient has not had a Chem, panel for several years so I am unsure of what her sodium level normally is. b. Continue current management for now. 4. Lethargy a. Lactic acid normal, no evidence of infection b. With exception of her antiepileptics occasions c. We will see how the patient responds to improvement of her acid/base disorder 5. Secondary hyperaldosteronism 6. Heart failure a. Currently compensated 7. Hypertension a. Continue home medicine 8. COPD 9. GERD 10. Functional quadriplegia  DVT prophylaxis: Lovenox Consultants: None  Code Status: DNR Family Communication: Unable to contact family.  The nursing facility gave him the name and number for the patient's sister, who is her primary emergency contact number.  Sister's name is Darl PikesSusan: home: [phone], Cell: [phone] Disposition Plan: [facility]   Levie HeritageStinson, [place] Keena J, [place]DO [facility] Pager [phone]  If 7PM-7AM, please contact  night-coverage [email or web address] Password [email or web address]

## 2018-04-04 ENCOUNTER — Encounter (HOSPITAL_COMMUNITY): Payer: Self-pay | Admitting: *Deleted

## 2018-04-04 ENCOUNTER — Other Ambulatory Visit: Payer: Self-pay

## 2018-04-04 DIAGNOSIS — Z515 Encounter for palliative care: Secondary | ICD-10-CM

## 2018-04-04 LAB — BLOOD GAS, ARTERIAL
Acid-Base Excess: 11.3 mmol/L — ABNORMAL HIGH (ref 0.0–2.0)
BICARBONATE: 33.1 mmol/L — AB (ref 20.0–28.0)
DRAWN BY: 105551
Delivery systems: POSITIVE
EXPIRATORY PAP: 5
FIO2: 35
Inspiratory PAP: 16
MECHANICAL RATE: 16
O2 Saturation: 97.4 %
PH ART: 7.333 — AB (ref 7.350–7.450)
PO2 ART: 102 mmHg (ref 83.0–108.0)
RATE: 16 resp/min
pCO2 arterial: 72.6 mmHg (ref 32.0–48.0)

## 2018-04-04 LAB — BASIC METABOLIC PANEL
Anion gap: 13 (ref 5–15)
BUN: 12 mg/dL (ref 6–20)
CHLORIDE: 80 mmol/L — AB (ref 101–111)
CO2: 35 mmol/L — AB (ref 22–32)
Calcium: 9.5 mg/dL (ref 8.9–10.3)
Creatinine, Ser: 0.33 mg/dL — ABNORMAL LOW (ref 0.44–1.00)
GFR calc Af Amer: >60 mL/min (ref >60)
GFR calc non Af Amer: >60 mL/min (ref >60)
GLUCOSE: 103 mg/dL — AB (ref 65–99)
POTASSIUM: 4.5 mmol/L (ref 3.5–5.1)
Sodium: 128 mmol/L — ABNORMAL LOW (ref 135–145)

## 2018-04-04 LAB — MRSA PCR SCREENING: MRSA by PCR: POSITIVE — AB

## 2018-04-04 MED ORDER — CHLORHEXIDINE GLUCONATE CLOTH 2 % EX PADS
6.0000 | MEDICATED_PAD | Freq: Every day | CUTANEOUS | Status: DC
  Administered 2018-04-04 – 2018-04-06 (×2): 6 via TOPICAL

## 2018-04-04 MED ORDER — IPRATROPIUM-ALBUTEROL 0.5-2.5 (3) MG/3ML IN SOLN
3.0000 mL | Freq: Two times a day (BID) | RESPIRATORY_TRACT | Status: DC
  Administered 2018-04-05 – 2018-04-06 (×3): 3 mL via RESPIRATORY_TRACT
  Filled 2018-04-04 (×3): qty 3

## 2018-04-04 MED ORDER — MUPIROCIN 2 % EX OINT
1.0000 "application " | TOPICAL_OINTMENT | Freq: Two times a day (BID) | CUTANEOUS | Status: DC
  Administered 2018-04-04 – 2018-04-06 (×5): 1 via NASAL
  Filled 2018-04-04 (×2): qty 22

## 2018-04-04 MED ORDER — LORAZEPAM 2 MG/ML IJ SOLN
1.0000 mg | Freq: Once | INTRAMUSCULAR | Status: AC
  Administered 2018-04-04: 1 mg via INTRAVENOUS
  Filled 2018-04-04: qty 1

## 2018-04-04 MED ORDER — ENSURE ENLIVE PO LIQD
237.0000 mL | Freq: Two times a day (BID) | ORAL | Status: DC
  Administered 2018-04-05 – 2018-04-06 (×2): 237 mL via ORAL

## 2018-04-04 NOTE — Care Management (Signed)
CM consult for long term care. Pt coming form SNF. This is not CM function. CSW aware. CM signing off.

## 2018-04-04 NOTE — Consult Note (Signed)
Consultation Note Date: 04/04/2018   Patient Name: Ashley Haley  DOB: 03-06-1961  MRN: [id number]  Age / Sex: 57 y.o., female  PCP: Bernerd Limbo, MD Referring Physician: Erick Blinks, MD  Reason for Consultation: Establishing goals of care and Psychosocial/spiritual support  HPI/Patient Profile: 57 y.o. female  with past medical history of mental retardation, traumatic brain injury, seizure, profound intellectual disabilities, functional quadriplegia, high blood pressure and cholesterol, dysphasia, heart failure, explosive personality disorder, history of UTI, resident of [facility] admitted on 04/03/2018 with hypoxia, hypercapnia, severe sleep apnea.   Clinical Assessment and Goals of Care: Mrs. Standish is resting quietly in bed.  She will make an briefly keep eye contact, but not communicate in any meaningful way.  I do not believe she is able to make even her most basic needs known.  She is calm and cooperative, but appears acutely/chronically ill.  There is no family at bedside at this time.  Call to mother, Izola Teague at [phone], fast busy. Sister Darl Pikes.  Home number [phone], left generic voicemail message requesting return call; cell phone [phone], left generic voicemail message requesting return call.  Conference with hospitalist related to plan of care and disposition. Conference with nursing staff related to plan of care and disposition.  Healthcare power of attorney NEXT OF KIN -responsible party, per residential SNF, is Sister Darl Pikes.  Home number [phone]; cell phone [phone].  Mother, Alva Kuenzel listed in chart, phone number fast busy.   SUMMARY OF RECOMMENDATIONS   Reach out to family/responsible party for goals of care.  Patient is currently DNR.  MOST form discussion would be valuable.  Code Status/Advance Care Planning:  DNR  Symptom  Management:   Per hospitalist, no additional needs at this time.  Palliative Prophylaxis:   Aspiration, Oral Care and Turn Reposition  Additional Recommendations (Limitations, Scope, Preferences):  Continue to treat the treatable but no CPR, no intubation.  Psycho-social/Spiritual:   Desire for further Chaplaincy support:no  Additional Recommendations: Caregiving  Support/Resources and Education on Hospice  Prognosis:   Unable to determine, based on outcomes.  One hospitalization in 6 months and relatively young age of 57 are promising for good outcomes, but complicated by bedbound status.  Albumin 3.3 on 04/03/18  Discharge Planning: Anticipate return to [facility] residential SNF.      Primary Diagnoses: Present on Admission: . Chronic hyponatremia . Secondary hyperaldosteronism (HCC) . Essential hypertension . Heart failure due to high blood pressure (HCC) . COPD with chronic bronchitis (HCC) . GERD (gastroesophageal reflux disease) . Functional quadriplegia (HCC) . Hypoxia . Hypercapnia . Lethargy   I have reviewed the medical record, interviewed the patient and family, and examined the patient. The following aspects are pertinent.  Past Medical History:  Diagnosis Date  . Abnormal posture   . Asthma   . CHF (congestive heart failure) (HCC)   . Contracture of joint of right hand   . COPD (chronic obstructive pulmonary disease) (HCC)   . Cursive epilepsy (HCC)   . Difficulty walking   .  Dysphagia   . Epilepsy (HCC)   . Explosive personality disorder (HCC)   . Explosive personality disorder (HCC)   . Functional quadriplegia (HCC)   . GERD (gastroesophageal reflux disease)   . Hyperlipemia   . Hypertension   . Hyposmolality syndrome   . Iron deficiency anemia   . Mental retardation   . Muscle weakness (generalized)   . Oth generalized epilepsy, intractable, w status epilepticus (HCC)   . Profound intellectual disabilities   . Seizures (HCC)   . TBI  (traumatic brain injury) (HCC)   . Urinary tract infection    Social History   Socioeconomic History  . Marital status: Single    Spouse name: Not on file  . Number of children: Not on file  . Years of education: Not on file  . Highest education level: Not on file  Occupational History  . Not on file  Social Needs  . Financial resource strain: Not on file  . Food insecurity:    Worry: Not on file    Inability: Not on file  . Transportation needs:    Medical: Not on file    Non-medical: Not on file  Tobacco Use  . Smoking status: Never Smoker  Substance and Sexual Activity  . Alcohol use: No  . Drug use: Not on file  . Sexual activity: Not on file  Lifestyle  . Physical activity:    Days per week: Not on file    Minutes per session: Not on file  . Stress: Not on file  Relationships  . Social connections:    Talks on phone: Not on file    Gets together: Not on file    Attends religious service: Not on file    Active member of club or organization: Not on file    Attends meetings of clubs or organizations: Not on file    Relationship status: Not on file  Other Topics Concern  . Not on file  Social History Narrative  . Not on file   History reviewed. No pertinent family history. Scheduled Meds: . arformoterol  15 mcg Nebulization BID  . aspirin EC  81 mg Oral Daily  . atorvastatin  40 mg Oral QPM  . Chlorhexidine Gluconate Cloth  6 each Topical Q0600  . divalproex  750 mg Oral BID  . enoxaparin (LOVENOX) injection  40 mg Subcutaneous Q24H  . feeding supplement (ENSURE ENLIVE)  237 mL Oral BID BM  . guaiFENesin  600 mg Oral BID  . ipratropium-albuterol  3 mL Nebulization Q6H  . levETIRAcetam  1,000 mg Oral BID  . levETIRAcetam  500 mg Oral BID  . mupirocin ointment  1 application Nasal BID  . oxcarbazepine  1,200 mg Oral BID  . pantoprazole  40 mg Oral Daily  . sodium chloride  1 g Oral TID   Continuous Infusions: PRN Meds:.acetaminophen **OR**  acetaminophen, albuterol, ondansetron **OR** ondansetron (ZOFRAN) IV Medications Prior to Admission:  Prior to Admission medications   Medication Sig Start Date End Date Taking? Authorizing Provider  arformoterol (BROVANA) 15 MCG/2ML NEBU Take 15 mcg by nebulization 2 (two) times daily.   Yes [provider]  aspirin EC 81 MG tablet Take 81 mg by mouth daily.   Yes [provider]  atorvastatin (LIPITOR) 40 MG tablet Take 40 mg by mouth every evening.    Yes [provider]  Calcium 500-100 MG-UNIT CHEW Chew 500 mg by mouth 3 (three) times daily.   Yes [provider]  clonazePAM (KLONOPIN) 0.5 MG tablet Take one tablet by mouth twice daily for anxiety Patient taking differently: Take 0.25-0.5 mg by mouth See admin instructions. Take 0.25mg  by mouth twice daily at 8a and 2000, then take 0.5mg   at 9a and 2100p 01/08/16  Yes Kimber Relic, MD  divalproex (DEPAKOTE SPRINKLE) 125 MG capsule Take 750 mg by mouth 2 (two) times daily. 6 capsules  Twice daily   Yes [provider]  Eyelid Cleansers (OCUSOFT LID SCRUB) PADS Apply 1 each topically daily. Wait 3-5 mins.between 2 eye meds.   Yes [provider]  ketoconazole (NIZORAL) 2 % shampoo Apply 1 application topically 2 (two) times a week. Every Monday and Thursdays   Yes [provider]  levETIRAcetam (KEPPRA) 1000 MG tablet Take 1,000 mg by mouth 2 (two) times daily. Take along with 500mg  to equal 1500mg    Yes [provider]  levETIRAcetam (KEPPRA) 500 MG tablet Take 500 mg by mouth 2 (two) times daily. Take with keppra 1000mg  to equal 1500mg    Yes [provider]  loratadine (CLARITIN) 10 MG tablet Take 10 mg by mouth daily.   Yes [provider]  LORazepam (ATIVAN) 2 MG/ML injection Administer 0.74ml intramuscularly for 1 dose as needed for seizure; may repeat 0.75 for 1 dose in 10-15 minutes as needed and call MD 01/26/14  Yes Reed, Tiffany L, DO  omeprazole  (PRILOSEC) 20 MG capsule Take 20 mg by mouth daily.   Yes [provider]  oxcarbazepine (TRILEPTAL) 600 MG tablet Take 1,200 mg by mouth 2 (two) times daily.   Yes [provider]  senna (SENOKOT) 8.6 MG TABS tablet Take 1 tablet by mouth 2 (two) times daily.   Yes [provider]  sodium chloride 1 g tablet Take 1 g by mouth 3 (three) times daily.   Yes [provider]  Vitamin D, Ergocalciferol, (DRISDOL) 50000 units CAPS capsule Take 50,000 Units by mouth every 30 (thirty) days. Take in the morning on the 4th   Yes [provider]   No Known Allergies Review of Systems  Unable to perform ROS: Patient nonverbal    Physical Exam  Constitutional: No distress.  Appears nonverbal, briefly makes but does not keep eye contact, unable to make her basic needs known, appears acutely/chronically ill  HENT:  Head: Atraumatic.  Cardiovascular: Normal rate.  Pulmonary/Chest: Effort normal. No respiratory distress.  Abdominal: Soft. She exhibits no distension. There is no guarding.  Musculoskeletal: She exhibits edema.  Edema in ankles and feet bilaterally  Neurological: She is alert.  Non verbal, known TBI  Skin: Skin is warm and dry.  Psychiatric:  No meaningful interaction.   Nursing note and vitals reviewed.   Vital Signs: BP 123/61   Pulse 71   Temp 99 F (37.2 C)   Resp 18   Ht 5\' 6"  (1.676 m)   Wt 93.5 kg (206 lb 2.1 oz)   SpO2 95%   BMI 33.27 kg/m  Pain Scale: CPOT       SpO2: SpO2: 95 % O2 Device:SpO2: 95 % O2 Flow Rate: .O2 Flow Rate (L/min): 3 L/min  IO: Intake/output summary:   Intake/Output Summary (Last 24 hours) at 04/04/2018 1419 Last data filed at 04/04/2018 0500 Gross per 24 hour  Intake 200 ml  Output 300 ml  Net -100 ml    LBM:   Baseline Weight: Weight: 113.4 kg (250 lb) Most recent weight: Weight: 93.5 kg (206 lb 2.1 oz)  Palliative Assessment/Data:   Flowsheet Rows     Most Recent Value  Intake  Tab  Referral Department  Hospitalist  Unit at Time of Referral  Intermediate Care Unit  Palliative Care Primary Diagnosis  Pulmonary  Date Notified  04/04/18  Palliative Care Type  New Palliative care  Reason for referral  Clarify Goals of Care  Date of Admission  04/03/18  Date first seen by Palliative Care  04/04/18  # of days Palliative referral response time  0 Day(s)  # of days IP prior to Palliative referral  1  Clinical Assessment  Palliative Performance Scale Score  20%  Pain Max last 24 hours  Not able to report  Pain Min Last 24 hours  Not able to report  Dyspnea Max Last 24 Hours  Not able to report  Dyspnea Min Last 24 hours  Not able to report  Psychosocial & Spiritual Assessment  Palliative Care Outcomes  Patient/Family meeting held?  Yes  Who was at the meeting?  pt at bedside  Palliative Care Outcomes  Provided advance care planning, Provided psychosocial or spiritual support, Clarified goals of care  Patient/Family wishes: Interventions discontinued/not started   Mechanical Ventilation      Time In: 1310 Time Out: 1340 Time Total: 30 minutes Greater than 50%  of this time was spent counseling and coordinating care related to the above assessment and plan.  Signed by: Katheran Aweasha A Dove, NP   Please contact Palliative Medicine Team phone at [phone] for questions and concerns.  For individual provider: See Loretha StaplerAmion

## 2018-04-04 NOTE — Progress Notes (Addendum)
Patient has severe severe sleep apnea, she will go as long as  90 - 120 seconds with out taking a breath. She makes small inspiratory efforts up to 30- 40 times a minute with 100 cc volume. After a few minutes she awakens in terror. Patient retains her PCO2 because of her chronic respiratory failure which most likely exacerbates her sleep apnea especially if oxygen is applied at higher concentrations. She will need some form of BiPAP for sleep with possible rate. Will check ABG in am as Dr Adrian BlackwaterStinson requested but it is doubtful her PCO2 will drop much,  60 to 70 may be her base line.

## 2018-04-04 NOTE — [place] FL2 (Signed)
[place] MEDICAID FL2 LEVEL OF CARE SCREENING TOOL     IDENTIFICATION  Patient Name: Ashley Haley Birthdate: 1961-03-05 Sex: female Admission Date (Current Location): 04/03/2018  [place]:  [place] [id number] L Facility and Address:  [facility],  [address], [place] [id number]      Provider Number: [id number]  Attending Physician Name and Address:  Erick BlinksMemon, Jehanzeb, MD  Relative Name and Phone Number:  Darl PikesSusan (sister) [phone]  [phone]    Current Level of Care: Hospital Recommended Level of Care: Skilled Nursing Facility Prior Approval Number:    Date Approved/Denied:   PASRR Number:    Discharge Plan: SNF    Current Diagnoses: Patient Active Problem List   Diagnosis Date Noted  . Chronic hyponatremia 04/03/2018  . 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 Essential hypertension 04/03/2018  . Heart failure due to high blood pressure (HCC) 04/03/2018  . COPD with chronic bronchitis (HCC) 04/03/2018  . GERD (gastroesophageal reflux disease) 04/03/2018  . Functional quadriplegia (HCC) 04/03/2018  . Hypoxia 04/03/2018  . Hypercapnia 04/03/2018  . Lethargy 04/03/2018  . Recurrent aspiration pneumonia (HCC) 12/09/2014  . DNR (do not resuscitate) 12/09/2014  . Other and unspecified hyperlipidemia 06/25/2014  . Allergic rhinitis 06/25/2014  . Mental retardation 02/28/2014  . Seizures (HCC) 02/28/2014  . Edema 02/28/2014    Orientation RESPIRATION BLADDER Height & Weight     Self  O2(see discharge summary) Incontinent Weight: 206 lb 2.1 oz (93.5 kg) Height:  5\' 6"  (167.6 cm)  BEHAVIORAL SYMPTOMS/MOOD NEUROLOGICAL BOWEL NUTRITION STATUS    Convulsions/Seizures Incontinent Diet(see discharge summary)  AMBULATORY STATUS COMMUNICATION OF NEEDS Skin   Total Care Does not communicate Normal                       Personal Care Assistance Level of Assistance  Total care       Total Care Assistance: Maximum  assistance   Functional Limitations Info  Sight, Hearing, Speech Sight Info: Adequate Hearing Info: Adequate Speech Info: Impaired    SPECIAL CARE FACTORS FREQUENCY                       Contractures Contractures Info: Not present    Additional Factors Info  Psychotropic Code Status Info: DNR Allergies Info: No Known Allergies Psychotropic Info: Klonopin, Ativan         Current Medications (04/04/2018):  This is the current hospital active medication list Current Facility-Administered Medications  Medication Dose Route Frequency Provider Last Rate Last Dose  . acetaminophen (TYLENOL) tablet 650 mg  650 mg Oral Q6H PRN Levie HeritageStinson, Jacob J, DO       Or  . acetaminophen (TYLENOL) suppository 650 mg  650 mg Rectal Q6H PRN Levie HeritageStinson, Jacob J, DO      . albuterol (PROVENTIL) (2.5 MG/3ML) 0.083% nebulizer solution 2.5 mg  2.5 mg Nebulization Q2H PRN Levie HeritageStinson, Jacob J, DO      . arformoterol (BROVANA) nebulizer solution 15 mcg  15 mcg Nebulization BID Levie HeritageStinson, Jacob J, DO   15 mcg at 04/04/18 0827  . aspirin EC tablet 81 mg  81 mg Oral Daily Levie HeritageStinson, Jacob J, DO      . atorvastatin (LIPITOR) tablet 40 mg  40 mg Oral QPM Levie HeritageStinson, Jacob J, DO      . Chlorhexidine Gluconate Cloth 2 % PADS 6 each  6 each Topical Q0600 Memon, Durward MallardJehanzeb, MD      . divalproex (DEPAKOTE SPRINKLE) capsule  750 mg  750 mg Oral BID Levie Heritage, DO      . enoxaparin (LOVENOX) injection 40 mg  40 mg Subcutaneous Q24H Levie Heritage, DO   40 mg at 04/04/18 0112  . feeding supplement (ENSURE ENLIVE) (ENSURE ENLIVE) liquid 237 mL  237 mL Oral BID BM Stinson, Jacob J, DO      . guaiFENesin (MUCINEX) 12 hr tablet 600 mg  600 mg Oral BID Stinson, Jacob J, DO      . ipratropium-albuterol (DUONEB) 0.5-2.5 (3) MG/3ML nebulizer solution 3 mL  3 mL Nebulization Q6H Candelaria Celeste J, DO   3 mL at 04/04/18 0827  . levETIRAcetam (KEPPRA) tablet 1,000 mg  1,000 mg Oral BID Levie Heritage, DO      . levETIRAcetam (KEPPRA)  tablet 500 mg  500 mg Oral BID Stinson, Jacob J, DO      . mupirocin ointment (BACTROBAN) 2 % 1 application  1 application Nasal BID Erick Blinks, MD      . ondansetron (ZOFRAN) tablet 4 mg  4 mg Oral Q6H PRN Levie Heritage, DO       Or  . ondansetron (ZOFRAN) injection 4 mg  4 mg Intravenous Q6H PRN Levie Heritage, DO      . Oxcarbazepine (TRILEPTAL) tablet 1,200 mg  1,200 mg Oral BID Stinson, Jacob J, DO      . pantoprazole (PROTONIX) EC tablet 40 mg  40 mg Oral Daily Levie Heritage, DO      . sodium chloride tablet 1 g  1 g Oral TID Levie Heritage, DO         Discharge Medications: Please see discharge summary for a list of discharge medications.  Relevant Imaging Results:  Relevant Lab Results:   Additional Information    Elliot Gault, LCSW

## 2018-04-04 NOTE — Clinical Social Work Note (Signed)
Clinical Social Work Assessment  Patient Details  Name: Ashley Haley MRN: [id number] Date of Birth: [birth year]  Date of referral:  04/04/18               Reason for consult:  Discharge Planning                Permission sought to share information with:    Permission granted to share information::     Name::        Agency::     Relationship::     Contact Information:     Housing/Transportation Living arrangements for the past 2 months:  Skilled Nursing Facility Source of Information:  Facility Patient Interpreter Needed:  None Criminal Activity/Legal Involvement Pertinent to Current Situation/Hospitalization:  No - Comment as needed Significant Relationships:  Siblings Lives with:  Facility Resident Do you feel safe going back to the place where you live?  Yes Need for family participation in patient care:  No (Coment)  Care giving concerns: [facility]   Social Worker assessment / plan: Pt is a 57 year old female [facility]. Pt has a history of TBI and a seizure disorder. Pt is a functional quadriplegic. Pt discussed in Progression today. There is a question as to whether pt has bipap at the SNF. Attempted to determine this today but had to leave a message. Awaiting return call. Will update MD once information is obtained. Plan [facility]. LCSW will follow and assist as needed.  Employment status:  Disabled (Comment on whether or not currently receiving Disability) Insurance information:  Teacher, English as a foreign languageManaged Medicare, [place] Recommendations:  Not assessed at this time Information / Referral to community resources:     Patient/Family's Response to care: Unable to assess pt's response to care. No family present.  Patient/Family's Understanding of and Emotional Response to Diagnosis, Current Treatment, and Prognosis: Pt unable to understand the diagnosis/treatment information due to history of TBI. Unable to  assess pt's emotional response. No family present.   Emotional Assessment Appearance:  Appears stated age Attitude/Demeanor/Rapport:  Unable to Assess Affect (typically observed):  Unable to Assess Orientation:    Alcohol / Substance use:  Not Applicable Psych involvement (Current and /or in the community):  No (Comment)  Discharge Needs  Concerns to be addressed:  Discharge Planning Concerns Readmission within the last 30 days:  No Current discharge risk:  None Barriers to Discharge:  No Barriers Identified   Ashley GaultKathleen Rui Wordell, LCSW 04/04/2018, 11:08 AM

## 2018-04-04 NOTE — Progress Notes (Signed)
PROGRESS NOTE    Ashley Haley  ZOX:[id number] DOB: 1961/09/11 DOA: 04/03/2018 PCP: Bernerd LimboAriza, Fernando Enrique, MD    Brief Narrative:  57 year old female with a history of COPD, traumatic brain injury with cognitive deficits, functional quadriplegia, seizure disorder, who is a resident of a nursing facility, was brought to the hospital with low oxygen levels and x-ray showing possible atelectasis versus small effusion.  She was found to have O2 saturations in the 70s.  On arrival to the emergency room, blood gas showed a pH of 7.28 with a PCO2 greater than 90.  She was started on positive pressure therapy.  Her PCO2 has since trended down, but she is still lethargic.  Unclear what her baseline cognitive function is.  She does not appear to have any meaningful interaction at this time and repeatedly tries to remove BiPAP/supplemental oxygen.  Palliative care has been consulted to assist with goals of care.   Assessment & Plan:   Principal Problem:   Hypoxia Active Problems:   Seizures (HCC)   Chronic hyponatremia   Secondary hyperaldosteronism (HCC)   Essential hypertension   Heart failure due to high blood pressure (HCC)   COPD with chronic bronchitis (HCC)   GERD (gastroesophageal reflux disease)   Functional quadriplegia (HCC)   Hypercapnia   Lethargy   Palliative care by specialist   1. Acute on chronic respiratory failure with hypoxia and hypercapnia.  Likely related to an element of sleep apnea/obesity hypoventilation syndrome.  Will likely need positive pressure therapy at night although she does not appear to want to wear this.  PCO2 has trended down since admission, but she is still very drowsy.  Will likely need to continue positive pressure at night. 2. Chronic hyponatremia.  Unclear what her baseline level is.  Check urine osmolarity, serum also ordered and urine sodium.  She is chronically on salt tablets 3. Seizure disorder.  Continue on Keppra and Depakote as well as  Trileptal 4. COPD.  Continue on Brovana 5. Hyperlipidemia.  Continue on statin 6. GERD.  Continue on PPI 7. 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  Unclear what her baseline level of function is.  She appears to be more lethargic at this time will need to be monitored. 8. Goals of care.  Patient is currently DNR.  She is a resident of a nursing facility.  This would likely benefit from positive pressure therapy/supplemental oxygen but she does not appear to want to wear this.  Likely her cognitive deficits will be a barrier.  Palliative care has been consulted to help address further goals of care.  Would likely benefit from filling out a MOST form and family's desire for patient to be readmitted in the future or not.   DVT prophylaxis: Lovenox Code Status: DNR Family Communication: No family present or could be reached by phone Disposition Plan: Anticipate discharge back to nursing facility once goals of care have been addressed   Consultants:   Palliative care  Procedures:     Antimicrobials:      Subjective: Patient does not answer questions.  Does not follow commands.  She is awake.  She is repeatedly tried to pull her nasal cannula oxygen off as well as her BiPAP mask off.  Objective: Vitals:   04/04/18 1608 04/04/18 1700 04/04/18 1800 04/04/18 1900  BP:   (!) 160/143 (!) 172/131  Pulse: (!) 31 84 90 98  Resp: 20 (!) 22 18 (!) 22  Temp: 99.1 F (37.3 C) 99.1 F (37.3 C)  99.3 F (37.4 C) 99.3 F (37.4 C)  TempSrc:      SpO2: 90% (!) 86% 90% 92%  Weight:      Height:        Intake/Output Summary (Last 24 hours) at 04/04/2018 2004 Last data filed at 04/04/2018 1841 Gross per 24 hour  Intake -  Output 600 ml  Net -600 ml   Filed Weights   04/03/18 1439 04/03/18 2254 04/04/18 0500  Weight: 113.4 kg (250 lb) 93.4 kg (205 lb 14.6 oz) 93.5 kg (206 lb 2.1 oz)    Examination:  General exam: Appears calm and comfortable  Respiratory system:  Clear to auscultation. Respiratory effort normal. Cardiovascular system: S1 & S2 heard, RRR. No JVD, murmurs, rubs, gallops or clicks. No pedal edema. Gastrointestinal system: Abdomen is nondistended, soft and nontender. No organomegaly or masses felt. Normal bowel sounds heard. Central nervous system: No gross motor deficits. Extremities: Symmetric 5 x 5 power. Skin: No rashes, lesions or ulcers Psychiatry: Nonverbal    Data Reviewed: I have personally reviewed following labs and imaging studies  CBC: Recent Labs  Lab 04/03/18 1618  WBC 8.4  NEUTROABS 5.8  HGB 12.9  HCT 41.1  MCV 101.7*  PLT 288   Basic Metabolic Panel: Recent Labs  Lab 04/03/18 1618 04/04/18 0424  NA 128* 128*  K 4.7 4.5  CL 80* 80*  CO2 39* 35*  GLUCOSE 98 103*  BUN 10 12  CREATININE <0.30* 0.33*  CALCIUM 9.5 9.5   GFR: Estimated Creatinine Clearance: 87.3 mL/min (A) (by C-G formula based on SCr of 0.33 mg/dL (L)). Liver Function Tests: Recent Labs  Lab 04/03/18 1618  AST 11*  ALT 10*  ALKPHOS 68  BILITOT 0.3  PROT 7.1  ALBUMIN 3.3*   No results for input(s): LIPASE, AMYLASE in the last 168 hours. No results for input(s): AMMONIA in the last 168 hours. Coagulation Profile: No results for input(s): INR, PROTIME in the last 168 hours. Cardiac Enzymes: No results for input(s): CKTOTAL, CKMB, CKMBINDEX, TROPONINI in the last 168 hours. BNP (last 3 results) No results for input(s): PROBNP in the last 8760 hours. HbA1C: No results for input(s): HGBA1C in the last 72 hours. CBG: No results for input(s): GLUCAP in the last 168 hours. Lipid Profile: No results for input(s): CHOL, HDL, LDLCALC, TRIG, CHOLHDL, LDLDIRECT in the last 72 hours. Thyroid Function Tests: No results for input(s): TSH, T4TOTAL, FREET4, T3FREE, THYROIDAB in the last 72 hours. Anemia Panel: No results for input(s): VITAMINB12, FOLATE, FERRITIN, TIBC, IRON, RETICCTPCT in the last 72 hours. Sepsis Labs: Recent Labs    Lab 04/03/18 1618  LATICACIDVEN 1.1    Recent Results (from the past 240 hour(s))  MRSA PCR Screening     Status: Abnormal   Collection Time: 04/03/18 10:42 PM  Result Value Ref Range Status   MRSA by PCR POSITIVE (A) NEGATIVE Final    Comment:        The GeneXpert MRSA Assay (FDA approved for NASAL specimens only), is one component of a comprehensive MRSA colonization surveillance program. It is not intended to diagnose MRSA infection nor to guide or monitor treatment for MRSA infections. RESULT CALLED TO, READ BACK BY AND VERIFIED WITH: HEARN,J @ 0512 ON 04/04/18 BY JUW Performed at [facility], [address] [place], [place] [address]          Radiology Studies: Dg Chest Portable 1 View  Result Date: 04/03/2018 CLINICAL DATA:  Cough, unresponsive. EXAM: PORTABLE CHEST 1 VIEW  COMPARISON:  Chest x-ray dated 03/03/2015. FINDINGS: Study is hypoinspiratory. Probable mild atelectasis and/or small pleural effusion at the LEFT lung base. Given the low lung volumes, lungs appear otherwise clear. No pneumothorax seen. Heart size and mediastinal contours are within normal limits. Osseous structures about the chest are unremarkable. IMPRESSION: Low lung volumes. Probable mild atelectasis and/or small pleural effusion at the LEFT lung base. Electronically Signed   By: Bary Richard M.D.   On: 04/03/2018 15:59        Scheduled Meds: . arformoterol  15 mcg Nebulization BID  . aspirin EC  81 mg Oral Daily  . atorvastatin  40 mg Oral QPM  . Chlorhexidine Gluconate Cloth  6 each Topical Q0600  . divalproex  750 mg Oral BID  . enoxaparin (LOVENOX) injection  40 mg Subcutaneous Q24H  . feeding supplement (ENSURE ENLIVE)  237 mL Oral BID BM  . guaiFENesin  600 mg Oral BID  . ipratropium-albuterol  3 mL Nebulization Q6H  . levETIRAcetam  1,000 mg Oral BID  . levETIRAcetam  500 mg Oral BID  . mupirocin ointment  1 application Nasal BID  . oxcarbazepine  1,200 mg Oral BID  .  pantoprazole  40 mg Oral Daily  . sodium chloride  1 g Oral TID   Continuous Infusions:   LOS: 1 day    Erick Blinks, MD [facility] Pager [phone]  If 7PM-7AM, please contact night-coverage [email or web address] Password [facility] 04/04/2018, 8:04 PM

## 2018-04-04 NOTE — Progress Notes (Signed)
Patient has been lethargic and drowsy throughout this shift. Patient unable to follow commands and not alert enough to safely swallow PO meds at this time. Will continue monitor.

## 2018-04-04 NOTE — Progress Notes (Signed)
Initial Nutrition Assessment  DOCUMENTATION CODES:      INTERVENTION:  Dysphagia 1 diet / NECTAR thick liquids  Fed by staff all meals    NUTRITION DIAGNOSIS:   Inadequate oral intake related to inability to eat(hx of recurrent aspiration pneumonia) as evidenced by NPO status(deemed unsafe to take po's at this time).   GOAL:   (Palliative medicine to discuss healthcare goals)  MONITOR:   Skin, Labs, Weight trends REASON FOR ASSESSMENT:   Malnutrition Screening Tool, Consult, Low Braden Assessment of nutrition requirement/status, COPD Protocol  ASSESSMENT: the patient is a 57 yo who is [facility]. Discussed with nursing who is concerned for patient ability to safely take in po at this time due to her respiratory status. Hx of recurrent aspiration. Off BiPAP per RT pulls off.   Documented 01/16/15 pt nursing home follow-up described patient care goal as comfort care, no aggressive interventions. Described by facility MD as non-verbal and total care 09/07/15. Last Nursing home MD entry 11/22/15 -No further Epic notes until yesterday. [facility] with hypoxia, hypernatremia, hypocholoridemia.   Patient weight hx nursing facility 226 lb 09/19/14, on 06/15/15 her wt 228 lb- her current weight reflects approximately 20 lb loss compared to 2.7 years ago. Timeframe of loss unknown.     Active Ambulatory Problems    Diagnosis Date Noted  . Mental retardation 02/28/2014  . Seizures (HCC) 02/28/2014  . Edema 02/28/2014  . Other and unspecified hyperlipidemia 06/25/2014  . Allergic rhinitis 06/25/2014  . Recurrent aspiration pneumonia (HCC) 12/09/2014  . DNR (do not resuscitate) 12/09/2014   Resolved Ambulatory Problems    Diagnosis Date Noted  . No Resolved Ambulatory Problems   Past Medical History:  Diagnosis Date  . Abnormal posture   . Asthma   . CHF (congestive heart failure) (HCC)   . Contracture of joint of right hand   . COPD (chronic  obstructive pulmonary disease) (HCC)   . Cursive epilepsy (HCC)   . Difficulty walking   . Dysphagia   . Epilepsy (HCC)   . Explosive personality disorder (HCC)   . Explosive personality disorder (HCC)   . Functional quadriplegia (HCC)   . GERD (gastroesophageal reflux disease)   . Hyperlipemia   . Hypertension   . Hyposmolality syndrome   . Iron deficiency anemia   . Mental retardation   . Muscle weakness (generalized)   . Oth generalized epilepsy, intractable, w status epilepticus (HCC)   . Profound intellectual disabilities   . Seizures (HCC)   . TBI (traumatic brain injury) (HCC)   . Urinary tract infection        Labs: Hyponatremia  BMP Latest Ref Rng & Units 04/04/2018 04/03/2018 03/03/2015  Glucose 65 - 99 mg/dL 119(J103(H) 98 94  BUN 6 - 20 mg/dL 12 10 17   Creatinine 0.44 - 1.00 mg/dL 4.78(G0.33(L) <9.56(O<0.30(L) 1.30(Q0.35(L)  Sodium 135 - 145 mmol/L 128(L) 128(L) 149(H)  Potassium 3.5 - 5.1 mmol/L 4.5 4.7 3.5  Chloride 101 - 111 mmol/L 80(L) 80(L) 101  CO2 22 - 32 mmol/L 35(H) 39(H) 41(HH)  Calcium 8.9 - 10.3 mg/dL 9.5 9.5 8.7    Meds: . arformoterol  15 mcg Nebulization BID  . aspirin EC  81 mg Oral Daily  . atorvastatin  40 mg Oral QPM  . Chlorhexidine Gluconate Cloth  6 each Topical Q0600  . divalproex  750 mg Oral BID  . enoxaparin (LOVENOX) injection  40 mg Subcutaneous Q24H  . feeding supplement (ENSURE ENLIVE)  237 mL Oral BID BM  . guaiFENesin  600 mg Oral BID  . ipratropium-albuterol  3 mL Nebulization Q6H  . levETIRAcetam  1,000 mg Oral BID  . levETIRAcetam  500 mg Oral BID  . mupirocin ointment  1 application Nasal BID  . oxcarbazepine  1,200 mg Oral BID  . pantoprazole  40 mg Oral Daily  . sodium chloride  1 g Oral TID    NUTRITION - FOCUSED PHYSICAL EXAM: Functional quadriplegia. Upper and lower extremity edema. Hx of lower extremity edema per chart review. Unable to preform physical assessment.    Diet Order:  Aspiration precautions DIET - DYS 1 Room service  appropriate? Yes; Fluid consistency: Nectar Thick    EDUCATION NEEDS:   Not appropriate for education at this time Skin:  Skin Assessment: Reviewed RN Assessment  Last BM:  unknown  Height:   Ht Readings from Last 1 Encounters:  04/03/18 5\' 6"  (1.676 m)    Weight:   Wt Readings from Last 1 Encounters:  04/04/18 206 lb 2.1 oz (93.5 kg)    Ideal Body Weight:  59 kg  BMI:  Body mass index is 33.27 kg/m.  Estimated Nutritional Needs:   Kcal:  1650-1800  Protein:  90-97 gr  Fluid:  < 2000 ml daily   Royann Shivers MS,RD,CSG,LDN Office: [phone] Pager: [phone]

## 2018-04-04 NOTE — Progress Notes (Addendum)
Patient is off BiPAP , keeps pulling at her face pulls oxygen off , pulls BiPAP off. Have placed several times. Cannot tell cause of patients distress.

## 2018-04-04 NOTE — Progress Notes (Signed)
Have increased rate of BiPAP to 16, patient seems to be breathing with Machine ( that is she takes a breath when machine cycles). This is not normally case for patient with rate set high.

## 2018-04-05 DIAGNOSIS — R569 Unspecified convulsions: Secondary | ICD-10-CM

## 2018-04-05 DIAGNOSIS — R0689 Other abnormalities of breathing: Secondary | ICD-10-CM

## 2018-04-05 DIAGNOSIS — R0902 Hypoxemia: Secondary | ICD-10-CM

## 2018-04-05 DIAGNOSIS — J449 Chronic obstructive pulmonary disease, unspecified: Secondary | ICD-10-CM

## 2018-04-05 DIAGNOSIS — Z7189 Other specified counseling: Secondary | ICD-10-CM

## 2018-04-05 DIAGNOSIS — R5383 Other fatigue: Secondary | ICD-10-CM

## 2018-04-05 DIAGNOSIS — E261 Secondary hyperaldosteronism: Secondary | ICD-10-CM

## 2018-04-05 DIAGNOSIS — E871 Hypo-osmolality and hyponatremia: Secondary | ICD-10-CM

## 2018-04-05 DIAGNOSIS — R532 Functional quadriplegia: Secondary | ICD-10-CM

## 2018-04-05 DIAGNOSIS — K219 Gastro-esophageal reflux disease without esophagitis: Secondary | ICD-10-CM

## 2018-04-05 DIAGNOSIS — I10 Essential (primary) hypertension: Secondary | ICD-10-CM

## 2018-04-05 LAB — OSMOLALITY: OSMOLALITY: 268 mosm/kg — AB (ref 275–295)

## 2018-04-05 LAB — RENAL FUNCTION PANEL
ANION GAP: 14 (ref 5–15)
Albumin: 3.5 g/dL (ref 3.5–5.0)
BUN: 8 mg/dL (ref 6–20)
CHLORIDE: 84 mmol/L — AB (ref 101–111)
CO2: 30 mmol/L (ref 22–32)
Calcium: 9.7 mg/dL (ref 8.9–10.3)
Creatinine, Ser: <0.3 mg/dL — ABNORMAL LOW (ref 0.44–1.00)
GLUCOSE: 98 mg/dL (ref 65–99)
PHOSPHORUS: 2.1 mg/dL — AB (ref 2.5–4.6)
POTASSIUM: 3.4 mmol/L — AB (ref 3.5–5.1)
Sodium: 128 mmol/L — ABNORMAL LOW (ref 135–145)

## 2018-04-05 LAB — CBC
HEMATOCRIT: 37.5 % (ref 36.0–46.0)
HEMOGLOBIN: 12.3 g/dL (ref 12.0–15.0)
MCH: 31.7 pg (ref 26.0–34.0)
MCHC: 32.8 g/dL (ref 30.0–36.0)
MCV: 96.6 fL (ref 78.0–100.0)
Platelets: 329 10*3/uL (ref 150–400)
RBC: 3.88 MIL/uL (ref 3.87–5.11)
RDW: 13.4 % (ref 11.5–15.5)
WBC: 15.4 10*3/uL — ABNORMAL HIGH (ref 4.0–10.5)

## 2018-04-05 LAB — BLOOD GAS, ARTERIAL
Acid-Base Excess: 8.8 mmol/L — ABNORMAL HIGH (ref 0.0–2.0)
Bicarbonate: 32.2 mmol/L — ABNORMAL HIGH (ref 20.0–28.0)
Drawn by: 21310
O2 Content: 21 L/min
O2 Saturation: 91.3 %
PO2 ART: 60.6 mmHg — AB (ref 83.0–108.0)
Patient temperature: 37
pCO2 arterial: 42.7 mmHg (ref 32.0–48.0)
pH, Arterial: 7.494 — ABNORMAL HIGH (ref 7.350–7.450)

## 2018-04-05 LAB — HIV ANTIBODY (ROUTINE TESTING W REFLEX): HIV Screen 4th Generation wRfx: NONREACTIVE

## 2018-04-05 MED ORDER — VALPROATE SODIUM 500 MG/5ML IV SOLN
INTRAVENOUS | Status: AC
  Filled 2018-04-05: qty 10

## 2018-04-05 MED ORDER — POTASSIUM CHLORIDE 20 MEQ/15ML (10%) PO SOLN
60.0000 meq | Freq: Once | ORAL | Status: AC
  Administered 2018-04-05: 60 meq via ORAL
  Filled 2018-04-05: qty 60

## 2018-04-05 MED ORDER — VALPROATE SODIUM 500 MG/5ML IV SOLN
750.0000 mg | Freq: Two times a day (BID) | INTRAVENOUS | Status: DC
  Administered 2018-04-05 (×2): 750 mg via INTRAVENOUS
  Filled 2018-04-05 (×4): qty 7.5

## 2018-04-05 MED ORDER — LEVETIRACETAM 500 MG PO TABS
1500.0000 mg | ORAL_TABLET | Freq: Two times a day (BID) | ORAL | Status: DC
  Administered 2018-04-05 – 2018-04-06 (×2): 1500 mg via ORAL
  Filled 2018-04-05 (×2): qty 3

## 2018-04-05 MED ORDER — LEVETIRACETAM IN NACL 1500 MG/100ML IV SOLN
1500.0000 mg | Freq: Two times a day (BID) | INTRAVENOUS | Status: DC
  Administered 2018-04-05 (×2): 1500 mg via INTRAVENOUS
  Filled 2018-04-05 (×2): qty 100

## 2018-04-05 MED ORDER — TRIAMCINOLONE ACETONIDE 0.5 % EX CREA
TOPICAL_CREAM | Freq: Three times a day (TID) | CUTANEOUS | Status: DC
  Administered 2018-04-05 (×2): via TOPICAL
  Administered 2018-04-06: 1 via TOPICAL
  Filled 2018-04-05: qty 15

## 2018-04-05 MED ORDER — LEVETIRACETAM 500 MG PO TABS: 1000.0000 mg | ORAL_TABLET | Freq: Two times a day (BID) | ORAL | Status: DC

## 2018-04-05 MED ORDER — DIVALPROEX SODIUM 125 MG PO CSDR
750.0000 mg | DELAYED_RELEASE_CAPSULE | Freq: Two times a day (BID) | ORAL | Status: DC
  Administered 2018-04-05 – 2018-04-06 (×2): 750 mg via ORAL
  Filled 2018-04-05 (×4): qty 6

## 2018-04-05 MED ORDER — BUDESONIDE 0.5 MG/2ML IN SUSP
0.5000 mg | Freq: Two times a day (BID) | RESPIRATORY_TRACT | Status: DC
  Administered 2018-04-05 – 2018-04-06 (×3): 0.5 mg via RESPIRATORY_TRACT
  Filled 2018-04-05 (×3): qty 2

## 2018-04-05 NOTE — Clinical Social Work Note (Addendum)
LCSW following. Spoke with Environmental education officerN Supervisor at [facility] today to inquire if pt is on Bipap at the SNF. Was informed that she is not. RN stated that they can provide Bipap if ordered at dc. They need to order the machine so would need an order with bipap settings signed by MD. Updated MD in Progression today. Per MD, pt does not always keep the bipap on but he feels like she should have it ordered for use at night. Will send the orders when MD has completed them.   Per MD, pt will potentially be stable for dc back to the facility tomorrow.   Palliative Care APNP is consulting and has been told by the staff at [facility] that pt's family are not responsive to their attempts to reach them. APNP has not received a response to the messages she has left for them. If no response received prior to pt's dc, will notify APS that pt should be assessed to determine if she needs a professional guardian assigned.

## 2018-04-05 NOTE — Progress Notes (Addendum)
Daily Progress Note   Patient Name: Ashley Haley       Date: 04/05/2018 DOB: [birth year]  Age: 57 y.o. MRN#: [id number] Attending Physician: Cleora Fleet, MD Primary Care Physician: Bernerd Limbo, MD Admit Date: 04/03/2018  Reason for Consultation/Follow-up: Establishing goals of care and Psychosocial/spiritual support  Subjective: Ashley Haley is resting quietly in bed.  Her eyes are open, but she does not try to communicate with me in any meaningful way.  She will briefly make but not keep eye contact.  There is no family at bedside at this time.  Conference with speech therapist at bedside related to plan of care. Conference with social work related to plan of care, DSS guardianship evaluation, disposition. Conference with hospitalist related to plan of care and disposition.  Ashley Haley would benefit from discussions with her responsible party related to rehospitalization and the use of measures such as BiPAP when this clearly makes her uncomfortable.  Call to sister, Shandel Busic.  Left voicemail message on home phone and cell phone.  Request return call.  Addendum; conversation with [facility] on 4/15.  She states that Ashley Haley has been a resident of [place] since April 2013 when she came from [facility].  She states her responsible party is sister, Garrett Bowring, and brother Royanne Warshaw.  RN Caffie Damme shares that staff called 3 times in February 2019 to set up a care plan meeting, calls were not returned.  Called family twice in March of this year, again no return phone calls.  Called April, prior to hospitalization, no return phone calls from family.  It is reported from nursing staff that they are able to contact Ashley Haley's family usually about once a year,  to verify CODE STATUS.   Ashley Haley would likely benefit from DSS guardianship to provide more continuity of care.  I anticipate that she will continue to be hospitalized due to the chronic and ongoing nature of her illnesses.  She would benefit from remaining "DNR" status, as she does have terminal and incurable disease.  Length of Stay: 2  Current Medications: Scheduled Meds:  . arformoterol  15 mcg Nebulization BID  . aspirin EC  81 mg Oral Daily  . atorvastatin  40 mg Oral QPM  . budesonide (PULMICORT) nebulizer solution  0.5 mg  Nebulization BID  . Chlorhexidine Gluconate Cloth  6 each Topical Q0600  . enoxaparin (LOVENOX) injection  40 mg Subcutaneous Q24H  . feeding supplement (ENSURE ENLIVE)  237 mL Oral BID BM  . guaiFENesin  600 mg Oral BID  . ipratropium-albuterol  3 mL Nebulization BID  . mupirocin ointment  1 application Nasal BID  . oxcarbazepine  1,200 mg Oral BID  . pantoprazole  40 mg Oral Daily  . potassium chloride  60 mEq Oral Once  . sodium chloride  1 g Oral TID  . triamcinolone cream   Topical TID    Continuous Infusions: . levETIRAcetam Stopped (04/05/18 0112)  . valproate sodium 750 mg (04/05/18 1143)    PRN Meds: acetaminophen **OR** acetaminophen, albuterol, ondansetron **OR** ondansetron (ZOFRAN) IV  Physical Exam  Constitutional: No distress.  Eyes open, nonverbal, unable to make her basic needs known.  HENT:  Traumatic brain injury history  Cardiovascular: Normal rate.  Pulmonary/Chest: Effort normal. No respiratory distress.  Abdominal: Soft. She exhibits no distension.  Musculoskeletal:  1+ edema bilateral ankles and feet, clearly nonambulatory  Neurological: She is alert.  Nonverbal, unable to determine orientation  Skin: Skin is warm and dry.  Psychiatric:  Unable to follow directions  Nursing note and vitals reviewed.           Vital Signs: BP (!) 109/57   Pulse 73   Temp 99.1 F (37.3 C)   Resp (!) 26   Ht 5\' 6"  (1.676 m)   Wt  91.6 kg (201 lb 15.1 oz)   SpO2 95%   BMI 32.59 kg/m  SpO2: SpO2: 95 % O2 Device: O2 Device: Aerosol Mask O2 Flow Rate: O2 Flow Rate (L/min): 8 L/min  Intake/output summary:   Intake/Output Summary (Last 24 hours) at 04/05/2018 1248 Last data filed at 04/05/2018 0500 Gross per 24 hour  Intake -  Output 1150 ml  Net -1150 ml   LBM:   Baseline Weight: Weight: 113.4 kg (250 lb) Most recent weight: Weight: 91.6 kg (201 lb 15.1 oz)       Palliative Assessment/Data:    Flowsheet Rows     Most Recent Value  Intake Tab  Referral Department  Hospitalist  Unit at Time of Referral  Intermediate Care Unit  Palliative Care Primary Diagnosis  Pulmonary  Date Notified  04/04/18  Palliative Care Type  New Palliative care  Reason for referral  Clarify Goals of Care  Date of Admission  04/03/18  Date first seen by Palliative Care  04/04/18  # of days Palliative referral response time  0 Day(s)  # of days IP prior to Palliative referral  1  Clinical Assessment  Palliative Performance Scale Score  20%  Pain Max last 24 hours  Not able to report  Pain Min Last 24 hours  Not able to report  Dyspnea Max Last 24 Hours  Not able to report  Dyspnea Min Last 24 hours  Not able to report  Psychosocial & Spiritual Assessment  Palliative Care Outcomes  Patient/Family meeting held?  Yes  Who was at the meeting?  pt at bedside  Palliative Care Outcomes  Provided advance care planning, Provided psychosocial or spiritual support, Clarified goals of care  Patient/Family wishes: Interventions discontinued/not started   Mechanical Ventilation      Patient Active Problem List   Diagnosis Date Noted  . Palliative care by specialist   . Chronic hyponatremia 04/03/2018  . 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 Essential hypertension 04/03/2018  .  Heart failure due to high blood pressure (HCC) 04/03/2018  . COPD with chronic bronchitis (HCC) 04/03/2018  . GERD (gastroesophageal reflux  disease) 04/03/2018  . Functional quadriplegia (HCC) 04/03/2018  . Hypoxia 04/03/2018  . Hypercapnia 04/03/2018  . Lethargy 04/03/2018  . Recurrent aspiration pneumonia (HCC) 12/09/2014  . DNR (do not resuscitate) 12/09/2014  . Other and unspecified hyperlipidemia 06/25/2014  . Allergic rhinitis 06/25/2014  . Mental retardation 02/28/2014  . Seizures (HCC) 02/28/2014  . Edema 02/28/2014    Palliative Care Assessment & Plan   Patient Profile: 57 y.o. female  with past medical history of mental retardation, traumatic brain injury, seizure, profound intellectual disabilities, functional quadriplegia, high blood pressure and cholesterol, dysphasia, heart failure, explosive personality disorder, history of UTI, resident [facility] admitted on 04/03/2018 with hypoxia, hypercapnia, severe sleep apnea.   Assessment: hypoxia, hypercapnia, severe sleep apnea: To return to facility with the benefit of BiPAP.  Ashley Haley seems to find BiPAP and oxygen via nasal cannula uncomfortable as she will constantly tried to remove the nasal cannula or the BiPAP mask from her face when she is alert enough to do so.  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  Recommendations/Plan:  Return to residential SNF, [facility], with BiPAP.  DSS guardianship evaluation requested by in-house social work.  Goals of Care and Additional Recommendations:  Limitations on Scope of Treatment: Treat the treatable but no CPR, no intubation.  Ashley Haley would benefit from discussions with her responsible party related to rehospitalization and the use of measures such as BiPAP when this clearly makes her uncomfortable.  Code Status:    Code Status Orders  (From admission, onward)        Start     Ordered   04/03/18 2243  Do not attempt resuscitation (DNR)  Continuous    Question Answer Comment  In the  event of cardiac or respiratory ARREST Do not call a "code blue"   In the event of cardiac or respiratory ARREST Do not perform Intubation, CPR, defibrillation or ACLS   In the event of cardiac or respiratory ARREST Use medication by any route, position, wound care, and other measures to relive pain and suffering. May use oxygen, suction and manual treatment of airway obstruction as needed for comfort.      04/03/18 2242    Code Status History    This patient has a current code status but no historical code status.       Prognosis:   Unable to determine based on outcomes.  One hospitalization in 6 months and relatively young age of 57 are promising for good outcomes, but complicated by bedbound status, and severe sleep apnea.  Albumin 3.3 on 04/03/18.   Discharge Planning:  Return to residential SNF, [facility].  Care plan was discussed with nursing staff, social work, and Dr. Sherryll BurgerShah.   Thank you for allowing the Palliative Medicine Team to assist in the care of this patient.   Time In: 1230 Time Out: 1305 Total Time 35 minutes Prolonged Time Billed  no       Greater than 50%  of this time was spent counseling and coordinating care related to the above assessment and plan.  Katheran Aweasha A Marsela Kuan, NP  Please contact Palliative Medicine Team phone at [phone] for questions and concerns.

## 2018-04-05 NOTE — Evaluation (Signed)
Clinical/Bedside Swallow Evaluation Patient Details  Name: Ashley Haley MRN: [id number] Date of Birth: January 16, 1961  Today's Date: 04/05/2018 Time: SLP Start Time (ACUTE ONLY): 1300 SLP Stop Time (ACUTE ONLY): 1326 SLP Time Calculation (min) (ACUTE ONLY): 26 min  Past Medical History:  Past Medical History:  Diagnosis Date  . Abnormal posture   . Asthma   . CHF (congestive heart failure) (HCC)   . Contracture of joint of right hand   . COPD (chronic obstructive pulmonary disease) (HCC)   . Cursive epilepsy (HCC)   . Difficulty walking   . Dysphagia   . Epilepsy (HCC)   . Explosive personality disorder (HCC)   . Explosive personality disorder (HCC)   . Functional quadriplegia (HCC)   . GERD (gastroesophageal reflux disease)   . Hyperlipemia   . Hypertension   . Hyposmolality syndrome   . Iron deficiency anemia   . Mental retardation   . Muscle weakness (generalized)   . Oth generalized epilepsy, intractable, w status epilepticus (HCC)   . Profound intellectual disabilities   . Seizures (HCC)   . TBI (traumatic brain injury) (HCC)   . Urinary tract infection    Past Surgical History:  Past Surgical History:  Procedure Laterality Date  . unable to obtain     HPI:  Ashley Haley a 57 y.o.femalewith a history of COPD, CHF,traumatic brain injury with cognitive deficits, secondary hyper aldosteronism, GERD, hypertension, functional quadriplegia, seizure disorder. Patient is unable to provide history. She is a resident of [facility]. At the facility, she had some low oxygen levels and had an x-ray that showed possible pneumonia. EMS was called to help assist put in a peripheral IV so she could be given antibiotics. When they arrived, her O2 saturation was 70%. She was brought to the emergency department for evaluation. At that time, she showed no signs of distress. BSE requested. SLP reached out to SNF and Pt was consuming a puree diet with nectar-thick liquids. Pt had  MBSS in 2015 and a mechanical soft diet with with thin vs NTL recommended.   Assessment / Plan / Recommendation Clinical Impression  Pt with limited participation in OME due to cognitive communication impairment, however Pt demonstrated adequate po readiness skills with orac acceptance of bolus. Pt nonverbal throughout the visit, but did turn to look at food choices and blinked eyes upon request x1. Pt with delayed oral transit across consistencies and textures (ice chip, thin water via teaspoon, NTL via cup, puree). Pt with occasional head tremor and one episode of staring off into the room. No overt coughing elicited, except for one episode during lunch meal after BSE. Recommend D1/puree with NTL, no straws, po medications crushed as able in puree. Pt is at risk for aspiration given dependent feeder status and cognitive deficits. Above discussed and reviewed with RN. No further SLP services indicated at this time.  SLP Visit Diagnosis: Dysphagia, oropharyngeal phase (R13.12)    Aspiration Risk  Moderate aspiration risk    Diet Recommendation Dysphagia 1 (Puree);Nectar-thick liquid   Liquid Administration via: Cup;Spoon;No straw Medication Administration: Crushed with puree Supervision: Full supervision/cueing for compensatory strategies;Staff to assist with self feeding Compensations: Slow rate;Small sips/bites Postural Changes: Seated upright at 90 degrees;Remain upright for at least 30 minutes after po intake    Other  Recommendations Oral Care Recommendations: Oral care before and after PO;Staff/trained caregiver to provide oral care Other Recommendations: Order thickener from pharmacy;Clarify dietary restrictions   Follow up Recommendations 24 hour supervision/assistance  Frequency and Duration            Prognosis Prognosis for Safe Diet Advancement: Fair Barriers to Reach Goals: Cognitive deficits      Swallow Study   General Date of Onset: 04/03/18 HPI: Ashley MilanSabrina  Terryis a 57 y.o.femalewith a history of COPD, CHF,traumatic brain injury with cognitive deficits, secondary hyper aldosteronism, GERD, hypertension, functional quadriplegia, seizure disorder. Patient is unable to provide history. She is a resident of [facility]. At the facility, she had some low oxygen levels and had an x-ray that showed possible pneumonia. EMS was called to help assist put in a peripheral IV so she could be given antibiotics. When they arrived, her O2 saturation was 70%. She was brought to the emergency department for evaluation. At that time, she showed no signs of distress. BSE requested. SLP reached out to SNF and Pt was consuming a puree diet with nectar-thick liquids. Pt had MBSS in 2015 and a mechanical soft diet with with thin vs NTL recommended. Type of Study: Bedside Swallow Evaluation Previous Swallow Assessment: MBSS 2015 D3/mech soft with NTL vs thin Diet Prior to this Study: Dysphagia 1 (puree);Nectar-thick liquids Temperature Spikes Noted: (99.1) Respiratory Status: Room air History of Recent Intubation: No Behavior/Cognition: Alert Oral Cavity Assessment: Within Functional Limits Oral Care Completed by SLP: No Vision: Impaired for self-feeding Self-Feeding Abilities: Total assist Patient Positioning: Upright in bed Baseline Vocal Quality: Not observed Volitional Cough: Cognitively unable to elicit Volitional Swallow: Unable to elicit    Oral/Motor/Sensory Function Overall Oral Motor/Sensory Function: (Pt unable to follow directions for OME)   Ice Chips Ice chips: Within functional limits Presentation: Spoon   Thin Liquid Thin Liquid: Within functional limits Presentation: Spoon Other Comments: suspect delay in swallow initiation    Nectar Thick Nectar Thick Liquid: Within functional limits Presentation: Cup;Spoon Other Comments: one cough after BSE when consuming lunch meal with nrusing staff   Honey Thick Honey Thick Liquid: Not tested    Puree Puree: Within functional limits Presentation: Spoon   Solid   Thank you,  Ashley Haley, CCC-SLP [phone]    Solid: Not tested        Ashley Haley 04/05/2018,1:42 PM

## 2018-04-05 NOTE — Progress Notes (Signed)
Foley catheter leaking- this RN placed an extra 2mL saline in foley catheter as well as advanced the catheter an inch. Will continue to monitor pt

## 2018-04-05 NOTE — Progress Notes (Addendum)
PROGRESS NOTE    Ashley [name]  ZOX:[id number] DOB: [birth year] DOA: 04/03/2018 PCP: Bernerd LimboAriza, Fernando Enrique, MD    Brief Narrative:  [age] year old female with a history of COPD, traumatic brain injury with cognitive deficits, functional quadriplegia, seizure disorder, who is a resident of a nursing facility, was brought to the hospital with low oxygen levels and x-ray showing possible atelectasis versus small effusion.  She was found to have O2 saturations in the 70s.  On arrival to the emergency room, blood gas showed a pH of 7.28 with a PCO2 greater than 90.  She was started on positive pressure therapy.  Her PCO2 has since trended down, but she is still lethargic.  Unclear what her baseline cognitive function is.  She does not appear to have any meaningful interaction at this time and repeatedly tries to remove BiPAP/supplemental oxygen.  Palliative care has been consulted to assist with goals of care.  Assessment & Plan:   Principal Problem:   Hypoxia Active Problems:   Seizures (HCC)   Chronic hyponatremia   Secondary hyperaldosteronism (HCC)   Essential hypertension   Heart failure due to high blood pressure (HCC)   COPD with chronic bronchitis (HCC)   GERD (gastroesophageal reflux disease)   Functional quadriplegia (HCC)   Hypercapnia   Lethargy   Palliative care by specialist   1. Acute on chronic respiratory failure with hypoxia and hypercapnia.  Likely related to an element of severe sleep apnea/obesity hypoventilation syndrome.  Will likely need positive pressure therapy at night although she does not appear to want to wear this.  PCO2 has trended down since admission and her drowsiness has improved.  She needs to continue positive pressure at night but likely will not comply with it as she continues to pull it off and not wear it.   2. Chronic hyponatremia.  Unclear what her baseline level is.  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  She is chronically on salt tablets 3. Seizure  disorder.  Continue on Keppra and Depakote as well as Trileptal when able to take p.o. Currently on IV keppra.   4. COPD.  Continue on Brovana, add pulmicort at discharge.  5. Hyperlipidemia.  Continue on statin 6. GERD.  Continue on PPI 7. 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  Unclear what her baseline level of function is.  She appears to be back to baseline.   8. Hypokalemia - replacement ordered. Recheck in AM. Check magnesium.  9. Rash RUE - likely irritation from safety mitts.  Trial of triamcinolone.   10. Goals of care.  Patient is currently DNR.  She is a resident of a nursing facility.  This would likely benefit from positive pressure therapy/supplemental oxygen but she does not appear to want to wear this.  Likely her cognitive deficits will be a barrier.  Palliative care has been consulted to help address further goals of care.  Would likely benefit from filling out a MOST form and family's desire for patient to be readmitted in the future or not.   DVT prophylaxis: Lovenox Code Status: DNR Family Communication: No family present or could be reached by phone Disposition Plan: Anticipate discharge back to nursing facility once goals of care have been addressed   Consultants:   Palliative care  Procedures:     Antimicrobials:   Subjective: Patient does not answer questions.  Does not follow commands.  She is awake.  She is repeatedly tried to pull her nasal cannula oxygen off as well as her  BiPAP mask off.  She appears to be back at her baseline as described by her long-term care facility.  Objective: Vitals:   04/05/18 0500 04/05/18 0809 04/05/18 0814 04/05/18 0900  BP:    (!) 109/57  Pulse:    73  Resp:    (!) 26  Temp:  98.7 F (37.1 C)  99.1 F (37.3 C)  TempSrc:  Oral    SpO2:  98% 100% 95%  Weight: 91.6 kg (201 lb 15.1 oz)     Height:        Intake/Output Summary (Last 24 hours) at 04/05/2018 0926 Last data filed at 04/05/2018 0500 Gross  per 24 hour  Intake -  Output 1150 ml  Net -1150 ml   Filed Weights   04/03/18 2254 04/04/18 0500 04/05/18 0500  Weight: 93.4 kg (205 lb 14.6 oz) 93.5 kg (206 lb 2.1 oz) 91.6 kg (201 lb 15.1 oz)    Examination:  General exam: Appears calm and comfortable  Respiratory system: Clear to auscultation. Respiratory effort normal. Cardiovascular system: S1 & S2 heard, RRR. No JVD, murmurs, rubs, gallops or clicks. No pedal edema. Gastrointestinal system: Abdomen is nondistended, soft and nontender. No organomegaly or masses felt. Normal bowel sounds heard. Central nervous system: No gross motor deficits. Extremities: Symmetric 5 x 5 power. Skin: erythematous rash right forearm    Psychiatry: Nonverbal at baseline.   Data Reviewed: I have personally reviewed following labs and imaging studies  CBC: Recent Labs  Lab 04/03/18 1618 04/05/18 0410  WBC 8.4 15.4*  NEUTROABS 5.8  --   HGB 12.9 12.3  HCT 41.1 37.5  MCV 101.7* 96.6  PLT 288 329   Basic Metabolic Panel: Recent Labs  Lab 04/03/18 1618 04/04/18 0424 04/05/18 0410  NA 128* 128* 128*  K 4.7 4.5 3.4*  CL 80* 80* 84*  CO2 39* 35* 30  GLUCOSE 98 103* 98  BUN 10 12 8   CREATININE <0.30* 0.33* <0.30*  CALCIUM 9.5 9.5 9.7  PHOS  --   --  2.1*   GFR: CrCl cannot be calculated (This lab value cannot be used to calculate CrCl because it is not a number: <0.30). Liver Function Tests: Recent Labs  Lab 04/03/18 1618 04/05/18 0410  AST 11*  --   ALT 10*  --   ALKPHOS 68  --   BILITOT 0.3  --   PROT 7.1  --   ALBUMIN 3.3* 3.5   No results for input(s): LIPASE, AMYLASE in the last 168 hours. No results for input(s): AMMONIA in the last 168 hours. Coagulation Profile: No results for input(s): INR, PROTIME in the last 168 hours. Cardiac Enzymes: No results for input(s): CKTOTAL, CKMB, CKMBINDEX, TROPONINI in the last 168 hours. BNP (last 3 results) No results for input(s): PROBNP in the last 8760 hours. HbA1C: No  results for input(s): HGBA1C in the last 72 hours. CBG: No results for input(s): GLUCAP in the last 168 hours. Lipid Profile: No results for input(s): CHOL, HDL, LDLCALC, TRIG, CHOLHDL, LDLDIRECT in the last 72 hours. Thyroid Function Tests: No results for input(s): TSH, T4TOTAL, FREET4, T3FREE, THYROIDAB in the last 72 hours. Anemia Panel: No results for input(s): VITAMINB12, FOLATE, FERRITIN, TIBC, IRON, RETICCTPCT in the last 72 hours. Sepsis Labs: Recent Labs  Lab 04/03/18 1618  LATICACIDVEN 1.1    Recent Results (from the past 240 hour(s))  MRSA PCR Screening     Status: Abnormal   Collection Time: 04/03/18 10:42 PM  Result Value Ref Range  Status   MRSA by PCR POSITIVE (A) NEGATIVE Final    Comment:        The GeneXpert MRSA Assay (FDA approved for NASAL specimens only), is one component of a comprehensive MRSA colonization surveillance program. It is not intended to diagnose MRSA infection nor to guide or monitor treatment for MRSA infections. RESULT CALLED TO, READ BACK BY AND VERIFIED WITH: HEARN,J @ 0512 ON 04/04/18 BY JUW Performed at [facility], [address], [place], [place] [address]     Radiology Studies: Dg Chest Portable 1 View  Result Date: 04/03/2018 CLINICAL DATA:  Cough, unresponsive. EXAM: PORTABLE CHEST 1 VIEW COMPARISON:  Chest x-ray dated 03/03/2015. FINDINGS: Study is hypoinspiratory. Probable mild atelectasis and/or small pleural effusion at the LEFT lung base. Given the low lung volumes, lungs appear otherwise clear. No pneumothorax seen. Heart size and mediastinal contours are within normal limits. Osseous structures about the chest are unremarkable. IMPRESSION: Low lung volumes. Probable mild atelectasis and/or small pleural effusion at the LEFT lung base. Electronically Signed   By: Bary Richard M.D.   On: 04/03/2018 15:59   Scheduled Meds: . arformoterol  15 mcg Nebulization BID  . aspirin EC  81 mg Oral Daily  . atorvastatin  40 mg Oral  QPM  . Chlorhexidine Gluconate Cloth  6 each Topical Q0600  . enoxaparin (LOVENOX) injection  40 mg Subcutaneous Q24H  . feeding supplement (ENSURE ENLIVE)  237 mL Oral BID BM  . guaiFENesin  600 mg Oral BID  . ipratropium-albuterol  3 mL Nebulization BID  . mupirocin ointment  1 application Nasal BID  . oxcarbazepine  1,200 mg Oral BID  . pantoprazole  40 mg Oral Daily  . potassium chloride  60 mEq Oral Once  . sodium chloride  1 g Oral TID   Continuous Infusions: . levETIRAcetam Stopped (04/05/18 0112)  . valproate sodium Stopped (04/05/18 6045)    Critical Care Time Spent:  32 mins   LOS: 2 days    Standley Dakins, MD [facility] Pager [phone]  If 7PM-7AM, please contact night-coverage [email or web address] Password [facility] 04/05/2018, 9:26 AM

## 2018-04-06 DIAGNOSIS — Z515 Encounter for palliative care: Secondary | ICD-10-CM

## 2018-04-06 LAB — OSMOLALITY, URINE: OSMOLALITY UR: 875 mosm/kg (ref 300–900)

## 2018-04-06 LAB — SODIUM, URINE, RANDOM: SODIUM UR: 30 mmol/L

## 2018-04-06 MED ORDER — BUDESONIDE 0.5 MG/2ML IN SUSP: 0.5000 mg | Freq: Two times a day (BID) | RESPIRATORY_TRACT | 0 refills | Status: AC

## 2018-04-06 MED ORDER — CLONAZEPAM 0.5 MG PO TABS: 0.2500 mg | ORAL_TABLET | ORAL | 0 refills | Status: AC

## 2018-04-06 MED ORDER — IPRATROPIUM-ALBUTEROL 0.5-2.5 (3) MG/3ML IN SOLN: 3.0000 mL | Freq: Four times a day (QID) | RESPIRATORY_TRACT | 0 refills | Status: AC | PRN

## 2018-04-06 MED ORDER — TRIAMCINOLONE ACETONIDE 0.5 % EX CREA: TOPICAL_CREAM | Freq: Three times a day (TID) | CUTANEOUS | 0 refills | Status: AC

## 2018-04-06 MED ORDER — ENSURE ENLIVE PO LIQD: 237.0000 mL | Freq: Two times a day (BID) | ORAL | 0 refills | Status: AC

## 2018-04-06 MED ORDER — LORAZEPAM 2 MG/ML IJ SOLN: INTRAMUSCULAR | 0 refills | Status: AC

## 2018-04-06 NOTE — Progress Notes (Signed)
Pts O2 sats decreased to 85-86% and sustaining. 2L O2 per [place] placed on pt, sats increased to 94%. Will continue to monitor pt

## 2018-04-06 NOTE — Discharge Instructions (Signed)
Follow with Primary MD  Bernerd Limbo, MD  and other consultant's as instructed your Hospitalist MD  Please get a complete blood count and chemistry panel checked by your Primary MD at your next visit, and again as instructed by your Primary MD.  Get Medicines reviewed and adjusted: Please take all your medications with you for your next visit with your Primary MD  Laboratory/radiological data: Please request your Primary MD to go over all hospital tests and procedure/radiological results at the follow up, please ask your Primary MD to get all Hospital records sent to his/her office.  In some cases, they will be blood work, cultures and biopsy results pending at the time of your discharge. Please request that your primary care M.D. follows up on these results.  Also Note the following: If you experience worsening of your admission symptoms, develop shortness of breath, life threatening emergency, suicidal or homicidal thoughts you must seek medical attention immediately by calling 911 or calling your MD immediately  if symptoms less severe.  You must read complete instructions/literature along with all the possible adverse reactions/side effects for all the Medicines you take and that have been prescribed to you. Take any new Medicines after you have completely understood and accpet all the possible adverse reactions/side effects.   Do not drive when taking Pain medications or sleeping medications (Benzodaizepines)  Do not take more than prescribed Pain, Sleep and Anxiety Medications. It is not advisable to combine anxiety,sleep and pain medications without talking with your primary care practitioner  Special Instructions: If you have smoked or chewed Tobacco  in the last 2 yrs please stop smoking, stop any regular Alcohol  and or any Recreational drug use.  Wear Seat belts while driving.  Please note: You were cared for by a hospitalist during your hospital stay. Once you are  discharged, your primary care physician will handle any further medical issues. Please note that NO REFILLS for any discharge medications will be authorized once you are discharged, as it is imperative that you return to your primary care physician (or establish a relationship with a primary care physician if you do not have one) for your post hospital discharge needs so that they can reassess your need for medications and monitor your lab values.      Acute Respiratory Distress Syndrome, Adult Acute respiratory distress syndrome is a life-threatening condition in which fluid collects in the lungs. This prevents the lungs from filling with air and passing oxygen into the blood. This can cause the lungs and other vital organs to fail. The condition usually develops following an infection, illness, surgery, or injury. What are the causes? This condition may be caused by:  An infection, such as sepsis or pneumonia.  A serious injury to the head or chest.  Severe bleeding from an injury.  A major surgery.  Breathing in harmful chemicals or smoke.  Blood transfusions.  A blood clot in the lungs.  Breathing in vomit (aspiration).  Near-drowning.  Inflammation of the pancreas (pancreatitis).  A drug overdose.  What are the signs or symptoms? Sudden shortness of breath and rapid breathing are the main symptoms of this condition. Other symptoms may include:  A fast or irregular heartbeat.  Skin, lips, or fingernails that look blue (cyanosis).  Confusion.  Tiredness or loss of energy.  Chest pain, particularly while taking a breath.  Coughing.  Restlessness or anxiety.  Fever. This is usually present if there is an underlying infection, such as pneumonia.  How is this diagnosed? This condition is diagnosed based on:  Your symptoms.  Medical history.  A physical exam. During the exam, your health care provider will listen to your heart and check for crackling or  wheezing sounds in your lungs.  You may also have other tests to confirm the diagnosis and measure how well your lungs are working. These may include:  Measuring the amount of oxygen in your blood. Your health care provider will use two methods to do this procedure: ? A small device (pulse oximeter) that is placed on your finger, earlobe, or toe. ? An arterial blood gas test. A sample of blood is taken from an artery and tested for oxygen levels.  Blood tests.  Chest X-rays or CT scans to look for fluid in the lungs.  Taking a sample of your sputum to test for infection.  Heart test, such as an echocardiogram or electrocardiogram. This is done to rule out any heart problems (such as heart failure) that may be causing your symptoms.  Bronchoscopy. During this test, a thin, flexible tube with a light is passed into the mouth or nose, down the windpipe, and into the lungs.  How is this treated? Treatment depends on the cause of your condition. The goal is to support you while your lungs heal and the underlying cause is treated. Treatment may include:  Oxygen therapy. This may be done through: ? A tube in your nose or a face mask. ? A ventilator. This device helps move air into and out of your lungs through a breathing tube that is inserted into your mouth or nose.  Continuous positive airway pressure (CPAP). This treatment uses mild air pressure to keep the airways open. A mask or other device will be placed over your nose or mouth.  Tracheostomy. During this procedure, a small cut is made in your neck to create an opening to your windpipe. A breathing tube is placed directly into your windpipe. The breathing tube is connected to a ventilator. This is done if you have problems with your airway or if you need a ventilator for a long period of time.  Positioning you to lie on your stomach (prone position).  Medicines, such as: ? Sedatives to help you relax. ? Blood pressure  medicines. ? Antibiotics to treat infection. ? Blood thinners to prevent blood clots. ? Diuretics to help prevent excess fluid.  Fluids and nutrients given through an IV tube.  Wearing compression stockings on your legs to prevent blood clots.  Extra corporeal membrane oxygenation (ECMO). This treatment takes blood outside your body, adds oxygen, and removes carbon dioxide. The blood is then returned to your body. This treatment is only used in severe cases.  Follow these instructions at home:  Take over-the-counter and prescription medicines only as told by your health care provider.  Do not use any products that contain nicotine or tobacco, such as cigarettes and e-cigarettes. If you need help quitting, ask your health care provider.  Limit alcohol intake to no more than 1 drink per day for nonpregnant women and 2 drinks per day for men. One drink equals 12 oz of beer, 5 oz of wine, or 1 oz of hard liquor.  Ask friends and family to help you if daily activities make you tired.  Attend any pulmonary rehabilitation as told by your health care provider. This may include: ? Education about your condition. ? Exercises. ? Breathing training. ? Counseling. ? Learning techniques to conserve energy. ? Nutrition  counseling.  Keep all follow-up visits as told by your health care provider. This is important. Contact a health care provider if:  You become short of breath during activity or while resting.  You develop a cough that does not go away.  You have a fever.  Your symptoms do not get better or they get worse.  You become anxious or depressed. Get help right away if:  You have sudden shortness of breath.  You develop sudden chest pain that does not go away.  You develop a rapid heart rate.  You develop swelling or pain in one of your legs.  You cough up blood.  You have trouble breathing.  Your skin, lips, or fingernails turn blue. These symptoms may represent a  serious problem that is an emergency. Do not wait to see if the symptoms will go away. Get medical help right away. Call your local emergency services (911 in the [place] Do not drive yourself to the hospital. Summary  Acute respiratory distress syndrome is a life-threatening condition in which fluid collects in the lungs, which leads the lungs and other vital organs to fail.  This condition usually develops following an infection, illness, surgery, or injury.  Sudden shortness of breath and rapid breathing are the main symptoms of acute respiratory distress syndrome.  Treatment may include oxygen therapy, continuous positive airway pressure (CPAP), tracheostomy, lying on your stomach (prone position), medicines, fluids and nutrients given through an IV tube, compression stockings, and extra corporeal membrane oxygenation (ECMO). This information is not intended to replace advice given to you by your health care provider. Make sure you discuss any questions you have with your health care provider. Document Released: 12/07/2005 Document Revised: 11/23/2016 Document Reviewed: 11/23/2016 Elsevier Interactive Patient Education  2017 [employer]   Hypoxemia Hypoxemia occurs when the blood does not contain enough oxygen. The body cannot work well when it does not have enough oxygen because every part of the body needs oxygen. Oxygen enters the lungs when we breathe in, then it travels to all parts of the body through the blood. Hypoxemia can develop suddenly or slowly. What are the causes? Common causes of this condition include:  Long-term (chronic) lung diseases, such as chronic obstructive pulmonary disease (COPD) or interstitial lung disease.  Disorders that affect breathing at night, such as sleep apnea.  Fluid buildup in the lungs (pulmonary edema).  Lung infection (pneumonia).  Lung or throat cancer.  Abnormal blood flow that bypasses the lungs (having a shunt).  Certain  diseasesthat affect nerves or muscles.  A collapsed lung (pneumothorax).  A blood clot in the lungs (pulmonary embolus).  Certain types of heart disease.  Slow or shallow breathing (hypoventilation).  Certain medicines.  High altitudes.  Toxic chemicals, smoke, and gases.  What are the signs or symptoms? In some cases, there may be no symptoms of this condition. If you do have symptoms, they may include:  Shortness of breath (dyspnea).  Bluish color of the skin, lips, or nail beds.  Breathing that is fast, noisy, or shallow.  A fast heartbeat.  Feeling tired or sleepy.  Feeling confused or worried.  If hypoxemia develops quickly, you will likely have dyspnea. If hypoxemia develops slowly over months or years, you may not notice any symptoms. How is this diagnosed? This condition is diagnosed by:  A physical exam.  Blood tests.  A test that measures the percentage of oxygen in your blood (pulse oximetry). This is done with a sensor that is  placed on your finger, toe, or earlobe.  How is this treated? Treatment for this condition depends on the underlying cause of your hypoxemia. You will likely be treated with oxygen therapy to restore your blood oxygen level. Depending on the cause of your hypoxemia, you may need oxygen therapy for a short time (weeks or months), or you may need it for the rest of your life. Your health care provider may also recommend other therapies to treat the underlying cause of your hypoxemia. Follow these instructions at home:  Take over-the-counter and prescription medicines only as told by your health care provider.  If you are on oxygen therapy, follow oxygen safety precautions as directed by your health care provider. These may include: ? Always having a backup supply of oxygen. ? Not allowing anyone to smoke or have a fire around oxygen. ? Handling oxygen tanks carefully and as instructed.  Do not use any products that contain  nicotine or tobacco, such as cigarettes and e-cigarettes. If you need help quitting, ask your health care provider. Stay away from people who smoke.  Keep all follow-up visits as told by your health care provider. This is important. Contact a health care provider if:  You have any concerns about your oxygen therapy.  You have trouble breathing, even during or after treatment.  You become short of breath when you exercise.  You are tired when you wake up.  You have a headache when you wake up. Get help right away if:  Your shortness of breath gets worse, especially with normal or minimal activity.  You have a bluish color of the skin, lips, or nail beds.  You become confused or you cannot think properly.  You cough up dark mucus or blood.  You have chest pain.  You have a fever. Summary  Hypoxemia occurs when the blood does not contain enough oxygen.  Hypoxemia may or may not cause symptoms. Often, the main symptom is shortness of breath (dyspnea).  Depending on the cause of your hypoxemia, you may need oxygen therapy for a short time (weeks or months), or you may need it for the rest of your life.  If you are on oxygen therapy, follow oxygen safety precautions as directed by your health care provider. This information is not intended to replace advice given to you by your health care provider. Make sure you discuss any questions you have with your health care provider. Document Released: 06/22/2011 Document Revised: 11/10/2016 Document Reviewed: 11/10/2016 Elsevier Interactive Patient Education  2017 [employer]

## 2018-04-06 NOTE — Clinical Social Work Note (Signed)
LCSW following. Per MD, pt stable for dc back to her SNF today. Attempted to reach pt's family to update. Could only leave a generic voice message.   [facility] Contacted [employer]PS to request evaluation of pt's situation to determine if she needs a professional guardian assigned.   [facility]

## 2018-04-06 NOTE — Care Management Important Message (Signed)
Important Message  Patient Details  Name: Ashley Haley MRN: [id number] Date of Birth: Jan 15, 1961   Medicare Important Message Given:  Yes    Chrisie Jankovich, Chrystine OilerSharley Diane, RN 04/06/2018, 10:04 AM

## 2018-04-06 NOTE — Discharge Summary (Signed)
Physician Discharge Summary  Kamillah Didonato WUJ:[id number] DOB: 06-04-1961 DOA: 04/03/2018  PCP: Bernerd Limbo, MD  Admit date: 04/03/2018 Discharge date: 04/06/2018  Admitted From: SNF Disposition: SNF Recommendations for Outpatient Follow-up:  1. Please address guardianship as been unable to contact any family members 2. Please obtain BMP/CBC in one week 3. Please address severe sleep apnea, pt has not tolerated face mask for bipap or cpap in hospital.   Discharge Condition: STABLE   CODE STATUS: DNR    Brief Hospitalization Summary: Please see all hospital notes, images, labs for full details of the hospitalization. FROM HPI: Mattelyn Imhoff is a 57 y.o. female with a history of COPD, CHF, traumatic brain injury with cognitive deficits, secondary hyper aldosteronism, GERD, hypertension, functional quadriplegia, seizure disorder.  Patient is unable to provide history.  She is a resident of [facility].  At the facility, she had some low oxygen levels and had an x-ray that showed possible pneumonia.  EMS was called to help assist put in a peripheral IV so she could be given antibiotics.  When they arrived, her O2 saturation was 70%.  She was brought to the emergency department for evaluation.  At that time, she showed no signs of distress.  Emergency Department Course: In the emergency department, she was initially on nasal cannula with an oxygen saturation of 93%.  Blood gas was drawn with a pH of 7.28 and a PCO2 of greater than 90.  Her PO14 was 15  57 year old female with a history of COPD, traumatic brain injury with cognitive deficits, functional quadriplegia, seizure disorder, who is a resident of a nursing facility, was brought to the hospital with low oxygen levels and x-ray showing possible atelectasis versus small effusion.  She was found to have O2 saturations in the 70s.  On arrival to the emergency room, blood gas showed a pH of 7.28 with a PCO2 greater than 90.  She was  started on positive pressure therapy.  Her PCO2 has since trended down, but she is still lethargic.  Unclear what her baseline cognitive function is.  She does not appear to have any meaningful interaction at this time and repeatedly tries to remove BiPAP/supplemental oxygen.  Palliative care has been consulted to assist with goals of care.   Assessment & Plan:   Principal Problem:   Hypoxia Active Problems:   Seizures (HCC)   Chronic hyponatremia   Secondary hyperaldosteronism (HCC)   Essential hypertension   Heart failure due to high blood pressure (HCC)   COPD with chronic bronchitis (HCC)   GERD (gastroesophageal reflux disease)   Functional quadriplegia (HCC)   Hypercapnia   Lethargy   Palliative care by specialist   1. Acute on chronic respiratory failure with hypoxia and hypercapnia.  Likely related to an element of severe sleep apnea/obesity hypoventilation syndrome.  Will likely need positive pressure therapy at night although she does not consistently want to wear this and frequently pulls the mask off.  PCO2 has trended down since admission and her drowsiness has improved.  She needs to continue positive pressure at night but likely will not comply with it as she continues to pull it off and not wear it.  therefore did not prescribe for her to have at SNF.  Pulmocort nebs added BID to respiratory medications, continue brovana nebs BID and added DUONEBS to be used as needed for SOB, wheezing or coughing.   2. Chronic hyponatremia.   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  She is chronically on  salt tablets 3. Seizure disorder.  Continue on Keppra and Depakote as well as Trileptal.  IM lorazepam ordered for seizure activity at SNF.    4. COPD.  Continue on Brovana, added pulmicort  Nebs BID.    5. Hyperlipidemia.  Continue on statin 6. GERD.  Continue on PPI 7. 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  Unclear what her baseline level of function is.  She appears to be back  to baseline.   8. Hypokalemia - replacement given.  9. Rash RUE - likely irritation from safety mitts.  Trial of triamcinolone creme for 7 days.   10. Goals of care.  Patient is currently DNR.  She is a resident of a nursing facility. Unable to contact any family members after multiple frequent attempts.   Palliative care has been consulted to help address further goals of care.  Would likely benefit from filling out a MOST form and family's desire for patient to be readmitted in the future or not.  SNF will further address guardianship by placing patient as ward of the state if needed and unable to get any family members.     DVT prophylaxis: Lovenox Code Status: DNR Family Communication: No family present or could be reached by phone Disposition Plan: SNF  Consultants:   Palliative care  Procedures:   Discharge Diagnoses:  Principal Problem:   Hypoxia Active Problems:   Seizures (HCC)   Chronic hyponatremia   Secondary hyperaldosteronism (HCC)   Essential hypertension   Heart failure due to high blood pressure (HCC)   COPD with chronic bronchitis (HCC)   GERD (gastroesophageal reflux disease)   Functional quadriplegia (HCC)   Hypercapnia   Lethargy   Palliative care by specialist   Goals of care, counseling/discussion  Discharge Instructions: Discharge Instructions    Call MD for:  difficulty breathing, headache or visual disturbances   Complete by:  As directed    Diet - low sodium heart healthy   Complete by:  As directed    Increase activity slowly   Complete by:  As directed      Allergies as of 04/06/2018   No Known Allergies     Medication List    TAKE these medications   arformoterol 15 MCG/2ML Nebu Commonly known as:  BROVANA Take 15 mcg by nebulization 2 (two) times daily.   aspirin EC 81 MG tablet Take 81 mg by mouth daily.   atorvastatin 40 MG tablet Commonly known as:  LIPITOR Take 40 mg by mouth every evening.   budesonide 0.5 MG/2ML  nebulizer solution Commonly known as:  PULMICORT Take 2 mLs (0.5 mg total) by nebulization 2 (two) times daily.   Calcium 500-100 MG-UNIT Chew Chew 500 mg by mouth 3 (three) times daily.   clonazePAM 0.5 MG tablet Commonly known as:  KLONOPIN Take 0.5-1 tablets (0.25-0.5 mg total) by mouth See admin instructions. Take 0.25mg  by mouth twice daily at 8a and 2000, then take 0.5mg   at 9a and 2100p What changed:    how much to take  how to take this  when to take this  additional instructions   divalproex 125 MG capsule Commonly known as:  DEPAKOTE SPRINKLE Take 750 mg by mouth 2 (two) times daily. 6 capsules  Twice daily   feeding supplement (ENSURE ENLIVE) Liqd Take 237 mLs by mouth 2 (two) times daily between meals.   ipratropium-albuterol 0.5-2.5 (3) MG/3ML Soln Commonly known as:  DUONEB Take 3 mLs by nebulization every 6 (six) hours as  needed (WHEEZING, COUGHING, SHORTNESS OF BREATH).   ketoconazole 2 % shampoo Commonly known as:  NIZORAL Apply 1 application topically 2 (two) times a week. Every Monday and Thursdays   levETIRAcetam 500 MG tablet Commonly known as:  KEPPRA Take 500 mg by mouth 2 (two) times daily. Take with keppra 1000mg  to equal 1500mg    levETIRAcetam 1000 MG tablet Commonly known as:  KEPPRA Take 1,000 mg by mouth 2 (two) times daily. Take along with 500mg  to equal 1500mg    loratadine 10 MG tablet Commonly known as:  CLARITIN Take 10 mg by mouth daily.   LORazepam 2 MG/ML injection Commonly known as:  ATIVAN Administer 0.27ml intramuscularly for 1 dose as needed for seizure; may repeat 0.75 for 1 dose in 10-15 minutes as needed and call MD   OCUSOFT LID SCRUB Pads Apply 1 each topically daily. Wait 3-5 mins.between 2 eye meds.   omeprazole 20 MG capsule Commonly known as:  PRILOSEC Take 20 mg by mouth daily.   oxcarbazepine 600 MG tablet Commonly known as:  TRILEPTAL Take 1,200 mg by mouth 2 (two) times daily.   senna 8.6 MG Tabs  tablet Commonly known as:  SENOKOT Take 1 tablet by mouth 2 (two) times daily.   sodium chloride 1 g tablet Take 1 g by mouth 3 (three) times daily.   triamcinolone cream 0.5 % Commonly known as:  KENALOG Apply topically 3 (three) times daily for 7 days.   Vitamin D (Ergocalciferol) 50000 units Caps capsule Commonly known as:  DRISDOL Take 50,000 Units by mouth every 30 (thirty) days. Take in the morning on the 4th      Follow-up Information    Eden Emms, Hali Marry, MD Follow up.   Specialty:  Internal Medicine Contact information: [address] [place] [address] [phone]          No Known Allergies Allergies as of 04/06/2018   No Known Allergies     Medication List    TAKE these medications   arformoterol 15 MCG/2ML Nebu Commonly known as:  BROVANA Take 15 mcg by nebulization 2 (two) times daily.   aspirin EC 81 MG tablet Take 81 mg by mouth daily.   atorvastatin 40 MG tablet Commonly known as:  LIPITOR Take 40 mg by mouth every evening.   budesonide 0.5 MG/2ML nebulizer solution Commonly known as:  PULMICORT Take 2 mLs (0.5 mg total) by nebulization 2 (two) times daily.   Calcium 500-100 MG-UNIT Chew Chew 500 mg by mouth 3 (three) times daily.   clonazePAM 0.5 MG tablet Commonly known as:  KLONOPIN Take 0.5-1 tablets (0.25-0.5 mg total) by mouth See admin instructions. Take 0.25mg  by mouth twice daily at 8a and 2000, then take 0.5mg   at 9a and 2100p What changed:    how much to take  how to take this  when to take this  additional instructions   divalproex 125 MG capsule Commonly known as:  DEPAKOTE SPRINKLE Take 750 mg by mouth 2 (two) times daily. 6 capsules  Twice daily   feeding supplement (ENSURE ENLIVE) Liqd Take 237 mLs by mouth 2 (two) times daily between meals.   ipratropium-albuterol 0.5-2.5 (3) MG/3ML Soln Commonly known as:  DUONEB Take 3 mLs by nebulization every 6 (six) hours as needed (WHEEZING, COUGHING, SHORTNESS  OF BREATH).   ketoconazole 2 % shampoo Commonly known as:  NIZORAL Apply 1 application topically 2 (two) times a week. Every Monday and Thursdays   levETIRAcetam 500 MG tablet Commonly known as:  KEPPRA Take 500 mg by mouth 2 (two) times daily. Take with keppra 1000mg  to equal 1500mg    levETIRAcetam 1000 MG tablet Commonly known as:  KEPPRA Take 1,000 mg by mouth 2 (two) times daily. Take along with 500mg  to equal 1500mg    loratadine 10 MG tablet Commonly known as:  CLARITIN Take 10 mg by mouth daily.   LORazepam 2 MG/ML injection Commonly known as:  ATIVAN Administer 0.37ml intramuscularly for 1 dose as needed for seizure; may repeat 0.75 for 1 dose in 10-15 minutes as needed and call MD   OCUSOFT LID SCRUB Pads Apply 1 each topically daily. Wait 3-5 mins.between 2 eye meds.   omeprazole 20 MG capsule Commonly known as:  PRILOSEC Take 20 mg by mouth daily.   oxcarbazepine 600 MG tablet Commonly known as:  TRILEPTAL Take 1,200 mg by mouth 2 (two) times daily.   senna 8.6 MG Tabs tablet Commonly known as:  SENOKOT Take 1 tablet by mouth 2 (two) times daily.   sodium chloride 1 g tablet Take 1 g by mouth 3 (three) times daily.   triamcinolone cream 0.5 % Commonly known as:  KENALOG Apply topically 3 (three) times daily for 7 days.   Vitamin D (Ergocalciferol) 50000 units Caps capsule Commonly known as:  DRISDOL Take 50,000 Units by mouth every 30 (thirty) days. Take in the morning on the 4th       Procedures/Studies: Dg Chest Portable 1 View  Result Date: 04/03/2018 CLINICAL DATA:  Cough, unresponsive. EXAM: PORTABLE CHEST 1 VIEW COMPARISON:  Chest x-ray dated 03/03/2015. FINDINGS: Study is hypoinspiratory. Probable mild atelectasis and/or small pleural effusion at the LEFT lung base. Given the low lung volumes, lungs appear otherwise clear. No pneumothorax seen. Heart size and mediastinal contours are within normal limits. Osseous structures about the chest are  unremarkable. IMPRESSION: Low lung volumes. Probable mild atelectasis and/or small pleural effusion at the LEFT lung base. Electronically Signed   By: Bary Richard M.D.   On: 04/03/2018 15:59      Subjective: PT MUCH MORE ALERT.  DOES NOT APPEAR TO BE IN ANY DISTRESS.   Discharge Exam: Vitals:   04/06/18 1000 04/06/18 1100  BP: (!) 146/66 130/63  Pulse: 77 77  Resp: (!) 23 (!) 23  Temp: 98.8 F (37.1 C) 98.8 F (37.1 C)  SpO2: 98% 98%   Vitals:   04/06/18 0800 04/06/18 0900 04/06/18 1000 04/06/18 1100  BP: 139/65 135/61 (!) 146/66 130/63  Pulse: 72 68 77 77  Resp: (!) 24 (!) 35 (!) 23 (!) 23  Temp: 98.2 F (36.8 C) 98.8 F (37.1 C) 98.8 F (37.1 C) 98.8 F (37.1 C)  TempSrc:      SpO2: 95% 96% 98% 98%  Weight:      Height:        General: Pt is alert, awake, nonverbal, not in acute distress Cardiovascular: RRR, S1/S2 +, no rubs, no gallops Respiratory: CTA bilaterally, no wheezing, no rhonchi Abdominal: Soft, NT, ND, bowel sounds + Extremities: no edema, no cyanosis   The results of significant diagnostics from this hospitalization (including imaging, microbiology, ancillary and laboratory) are listed below for reference.     Microbiology: Recent Results (from the past 240 hour(s))  MRSA PCR Screening     Status: Abnormal   Collection Time: 04/03/18 10:42 PM  Result Value Ref Range Status   MRSA by PCR POSITIVE (A) NEGATIVE Final    Comment:        The GeneXpert MRSA  Assay (FDA approved for NASAL specimens only), is one component of a comprehensive MRSA colonization surveillance program. It is not intended to diagnose MRSA infection nor to guide or monitor treatment for MRSA infections. RESULT CALLED TO, READ BACK BY AND VERIFIED WITH: HEARN,J @ 0512 ON 04/04/18 BY JUW Performed at [facility], [address] [place], [place] [address]      Labs: BNP (last 3 results) Recent Labs    04/03/18 1618  BNP 12.0   Basic Metabolic Panel: Recent Labs   Lab 04/03/18 1618 04/04/18 0424 04/05/18 0410  NA 128* 128* 128*  K 4.7 4.5 3.4*  CL 80* 80* 84*  CO2 39* 35* 30  GLUCOSE 98 103* 98  BUN 10 12 8   CREATININE <0.30* 0.33* <0.30*  CALCIUM 9.5 9.5 9.7  PHOS  --   --  2.1*   Liver Function Tests: Recent Labs  Lab 04/03/18 1618 04/05/18 0410  AST 11*  --   ALT 10*  --   ALKPHOS 68  --   BILITOT 0.3  --   PROT 7.1  --   ALBUMIN 3.3* 3.5   No results for input(s): LIPASE, AMYLASE in the last 168 hours. No results for input(s): AMMONIA in the last 168 hours. CBC: Recent Labs  Lab 04/03/18 1618 04/05/18 0410  WBC 8.4 15.4*  NEUTROABS 5.8  --   HGB 12.9 12.3  HCT 41.1 37.5  MCV 101.7* 96.6  PLT 288 329   Cardiac Enzymes: No results for input(s): CKTOTAL, CKMB, CKMBINDEX, TROPONINI in the last 168 hours. BNP: Invalid input(s): POCBNP CBG: No results for input(s): GLUCAP in the last 168 hours. D-Dimer No results for input(s): DDIMER in the last 72 hours. Hgb A1c No results for input(s): HGBA1C in the last 72 hours. Lipid Profile No results for input(s): CHOL, HDL, LDLCALC, TRIG, CHOLHDL, LDLDIRECT in the last 72 hours. Thyroid function studies No results for input(s): TSH, T4TOTAL, T3FREE, THYROIDAB in the last 72 hours.  Invalid input(s): FREET3 Anemia work up No results for input(s): VITAMINB12, FOLATE, FERRITIN, TIBC, IRON, RETICCTPCT in the last 72 hours. Urinalysis    Component Value Date/Time   COLORURINE YELLOW 04/03/2018 1526   APPEARANCEUR CLEAR 04/03/2018 1526   LABSPEC 1.026 04/03/2018 1526   PHURINE 5.0 04/03/2018 1526   GLUCOSEU NEGATIVE 04/03/2018 1526   HGBUR NEGATIVE 04/03/2018 1526   BILIRUBINUR NEGATIVE 04/03/2018 1526   KETONESUR 5 (A) 04/03/2018 1526   PROTEINUR NEGATIVE 04/03/2018 1526   UROBILINOGEN 0.2 03/03/2015 0820   NITRITE NEGATIVE 04/03/2018 1526   LEUKOCYTESUR NEGATIVE 04/03/2018 1526   Sepsis Labs Invalid input(s): PROCALCITONIN,  WBC,  LACTICIDVEN Microbiology Recent  Results (from the past 240 hour(s))  MRSA PCR Screening     Status: Abnormal   Collection Time: 04/03/18 10:42 PM  Result Value Ref Range Status   MRSA by PCR POSITIVE (A) NEGATIVE Final    Comment:        The GeneXpert MRSA Assay (FDA approved for NASAL specimens only), is one component of a comprehensive MRSA colonization surveillance program. It is not intended to diagnose MRSA infection nor to guide or monitor treatment for MRSA infections. RESULT CALLED TO, READ BACK BY AND VERIFIED WITH: HEARN,J @ 0512 ON 04/04/18 BY JUW Performed at [facility], [address], [place], [place] [address]     Time coordinating discharge: 54 MINUTES  SIGNED:  Standley Dakins, MD  [facility] 04/06/2018, 11:46 AM Pager [phone]  If 7PM-7AM, please contact night-coverage [email or web address]  Password [email or web address]

## 2018-04-06 NOTE — Progress Notes (Addendum)
Foley catheter placed in the Emergency room on 04/03/18-no order for foley catheter since admit. This RN placed foley catheter order.

## 2019-04-03 IMAGING — CR DG CHEST 1V PORT
1 series · 1 of 1 positions shown · non-contrast
Comparison: Chest x-ray dated 03/03/2015.

CLINICAL DATA: Cough, unresponsive.

EXAM:
PORTABLE CHEST 1 VIEW

[portable]
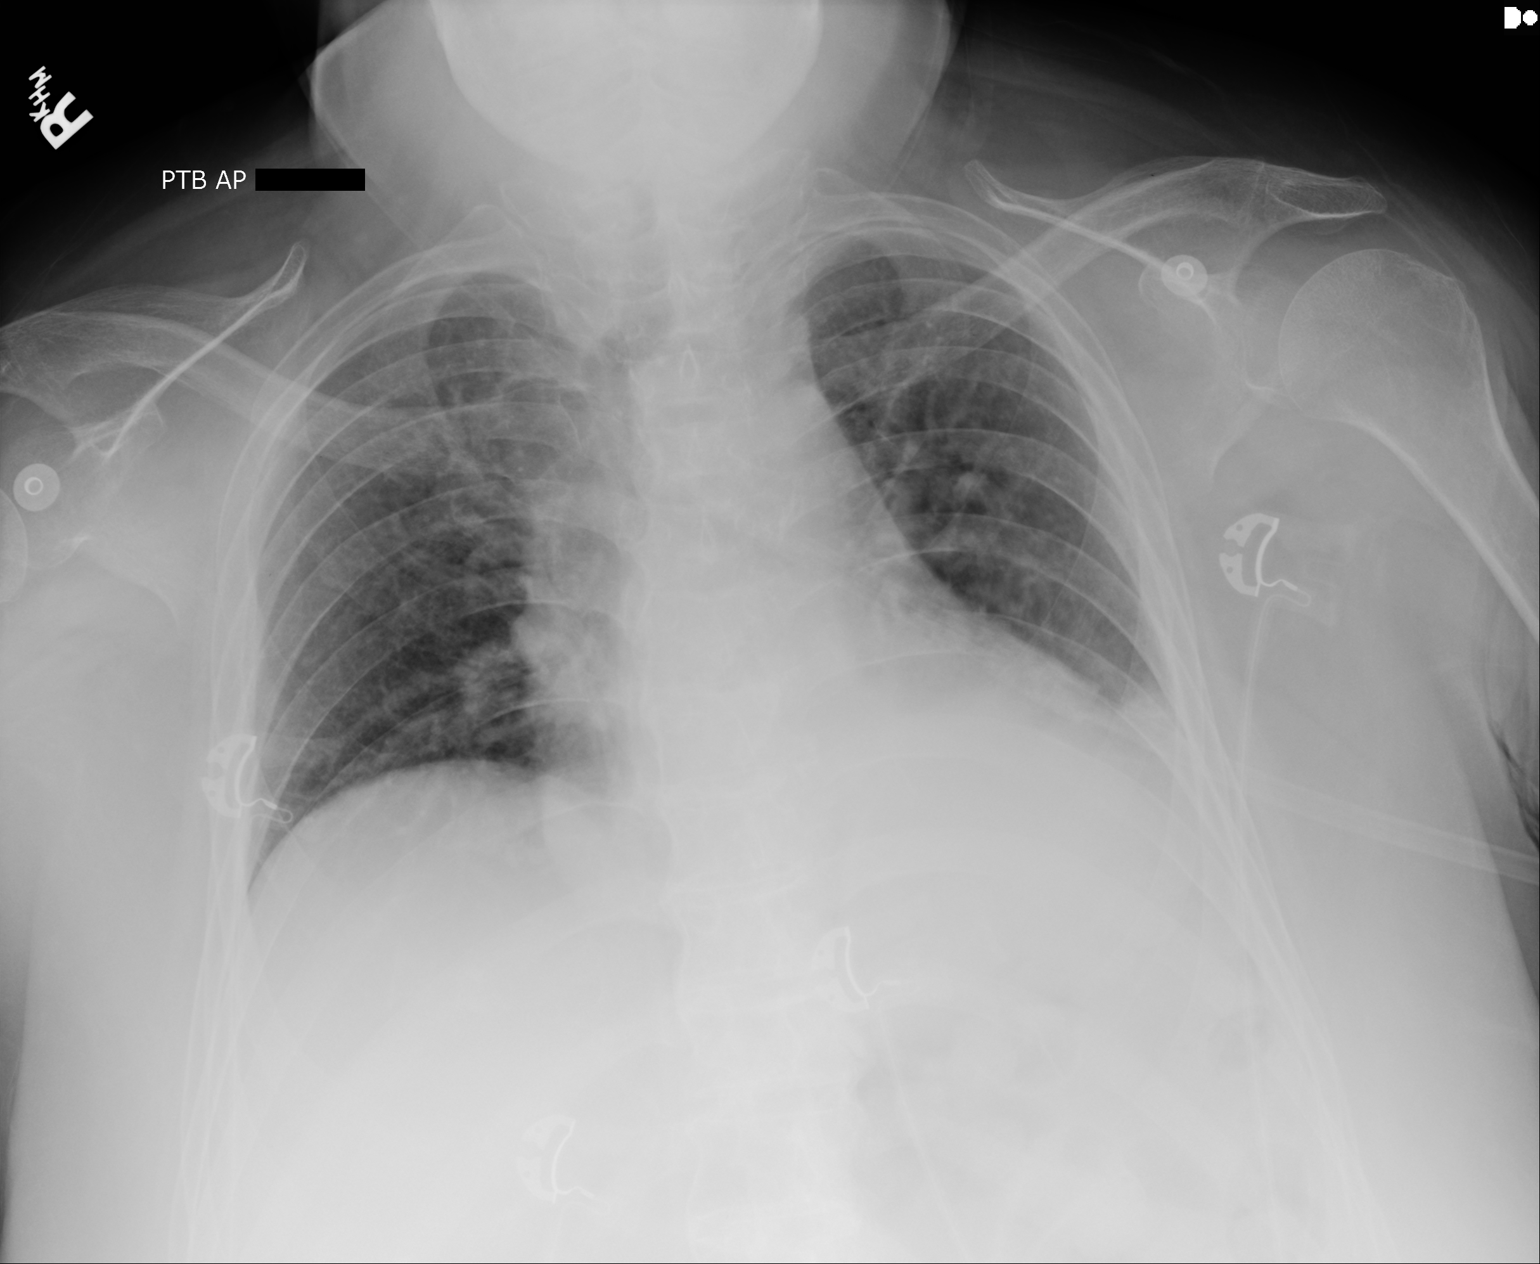

[1 of 1 positions shown; findings below may reference images not displayed]

FINDINGS: Study is hypoinspiratory. Probable mild atelectasis and/or small
pleural effusion at the LEFT lung base. Given the low lung volumes,
lungs appear otherwise clear. No pneumothorax seen. Heart size and
mediastinal contours are within normal limits. Osseous structures
about the chest are unremarkable.
IMPRESSION: Low lung volumes. Probable mild atelectasis and/or small pleural
effusion at the LEFT lung base.

## 2024-08-21 DEATH — deceased
